# Patient Record
Sex: Female | Born: 1980 | Race: White | Hispanic: No | Marital: Married | Smoking: Never smoker
Health system: Southern US, Community
[De-identification: ages and names within clinical notes are randomized; demographics above are authoritative.]

## PROBLEM LIST (undated history)

## (undated) ENCOUNTER — Inpatient Hospital Stay (HOSPITAL_COMMUNITY): Payer: Self-pay

## (undated) DIAGNOSIS — N1330 Unspecified hydronephrosis: Secondary | ICD-10-CM

## (undated) DIAGNOSIS — M459 Ankylosing spondylitis of unspecified sites in spine: Secondary | ICD-10-CM

## (undated) DIAGNOSIS — Z8619 Personal history of other infectious and parasitic diseases: Secondary | ICD-10-CM

## (undated) DIAGNOSIS — I499 Cardiac arrhythmia, unspecified: Secondary | ICD-10-CM

## (undated) DIAGNOSIS — Z8759 Personal history of other complications of pregnancy, childbirth and the puerperium: Secondary | ICD-10-CM

## (undated) DIAGNOSIS — D649 Anemia, unspecified: Secondary | ICD-10-CM

## (undated) DIAGNOSIS — Z87440 Personal history of urinary (tract) infections: Secondary | ICD-10-CM

## (undated) DIAGNOSIS — R109 Unspecified abdominal pain: Secondary | ICD-10-CM

## (undated) DIAGNOSIS — K589 Irritable bowel syndrome without diarrhea: Secondary | ICD-10-CM

## (undated) DIAGNOSIS — Z1589 Genetic susceptibility to other disease: Secondary | ICD-10-CM

## (undated) DIAGNOSIS — K219 Gastro-esophageal reflux disease without esophagitis: Secondary | ICD-10-CM

## (undated) DIAGNOSIS — E7212 Methylenetetrahydrofolate reductase deficiency: Secondary | ICD-10-CM

## (undated) DIAGNOSIS — O26899 Other specified pregnancy related conditions, unspecified trimester: Secondary | ICD-10-CM

## (undated) HISTORY — PX: DILATION AND CURETTAGE OF UTERUS: SHX78

## (undated) HISTORY — DX: Personal history of urinary (tract) infections: Z87.440

## (undated) HISTORY — DX: Anemia, unspecified: D64.9

## (undated) HISTORY — DX: Personal history of other infectious and parasitic diseases: Z86.19

## (undated) HISTORY — DX: Gastro-esophageal reflux disease without esophagitis: K21.9

## (undated) HISTORY — DX: Genetic susceptibility to other disease: Z15.89

## (undated) HISTORY — DX: Personal history of urinary (tract) infections: Z87.59

## (undated) HISTORY — DX: Methylenetetrahydrofolate reductase deficiency: E72.12

---

## 2001-09-14 ENCOUNTER — Other Ambulatory Visit: Admission: RE | Admit: 2001-09-14 | Discharge: 2001-09-14 | Payer: Self-pay | Admitting: Family Medicine

## 2002-09-06 ENCOUNTER — Encounter: Admission: RE | Admit: 2002-09-06 | Discharge: 2002-09-06 | Payer: Self-pay | Admitting: Family Medicine

## 2002-09-06 ENCOUNTER — Encounter: Payer: Self-pay | Admitting: Family Medicine

## 2003-09-09 ENCOUNTER — Other Ambulatory Visit: Admission: RE | Admit: 2003-09-09 | Discharge: 2003-09-09 | Payer: Self-pay | Admitting: Family Medicine

## 2005-02-11 ENCOUNTER — Other Ambulatory Visit: Admission: RE | Admit: 2005-02-11 | Discharge: 2005-02-11 | Payer: Self-pay | Admitting: Family Medicine

## 2006-07-29 ENCOUNTER — Encounter: Admission: RE | Admit: 2006-07-29 | Discharge: 2006-07-29 | Payer: Self-pay | Admitting: Family Medicine

## 2008-06-13 IMAGING — US US TRANSVAGINAL NON-OB
1 series · 14 of 25 positions shown · non-contrast
Comparison: None.

CLINICAL DATA: Right lower quadrant pain.  Question ectopic pregnancy.  
 TRANSABDOMINAL AND TRANSVAGINAL PELVIC ULTRASOUND:
TECHNIQUE: Both transabdominal and transvaginal ultrasound examinations of the pelvis were performed including evaluation of the uterus, ovaries, adnexal regions, and pelvic cul-de-sac.

[Series 1: unknown · 0.32mm/px · 14 of 44 slices shown]
[im 1/44]
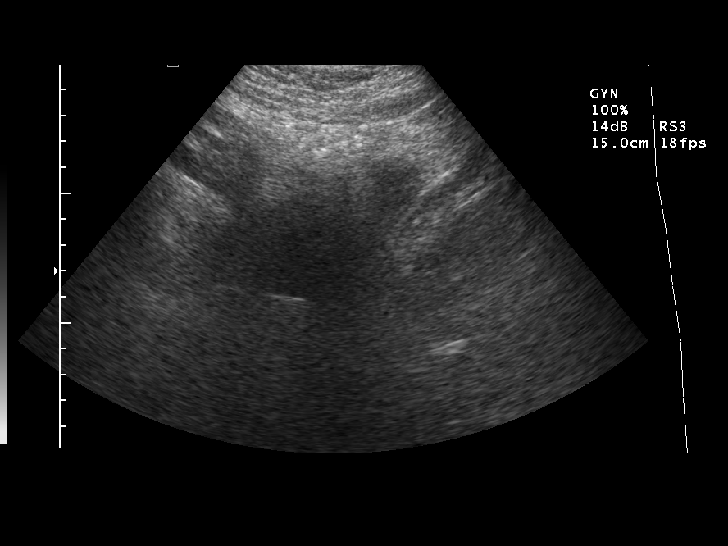
[im 4/44]
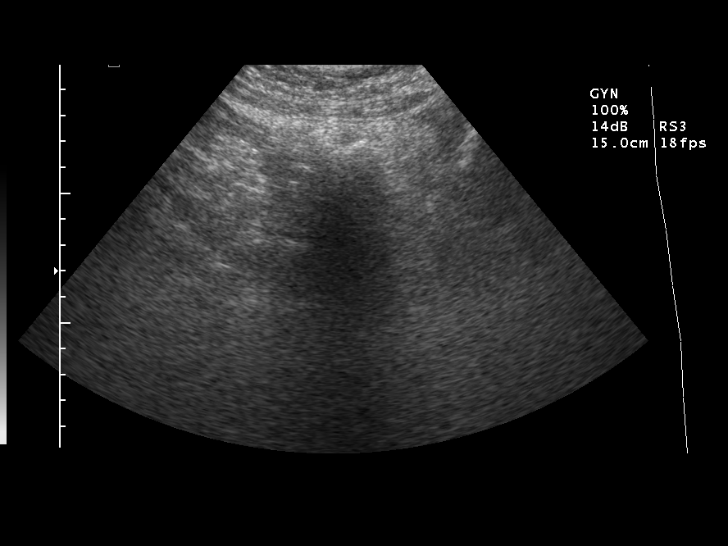
[im 8/44]
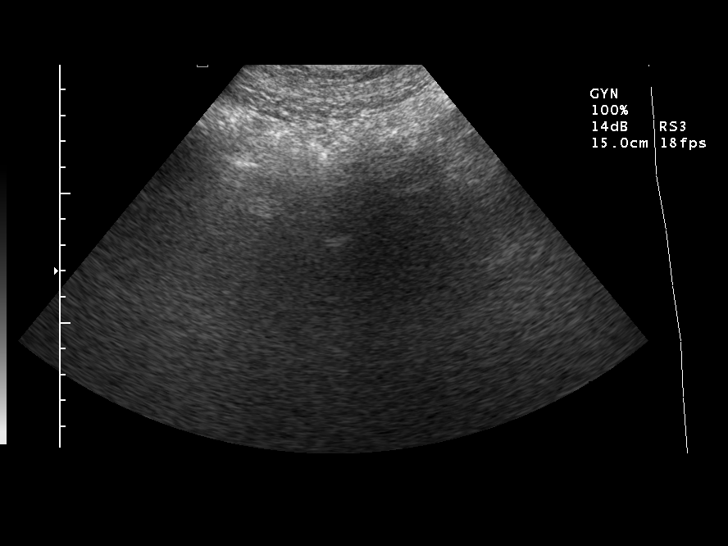
[im 11/44]
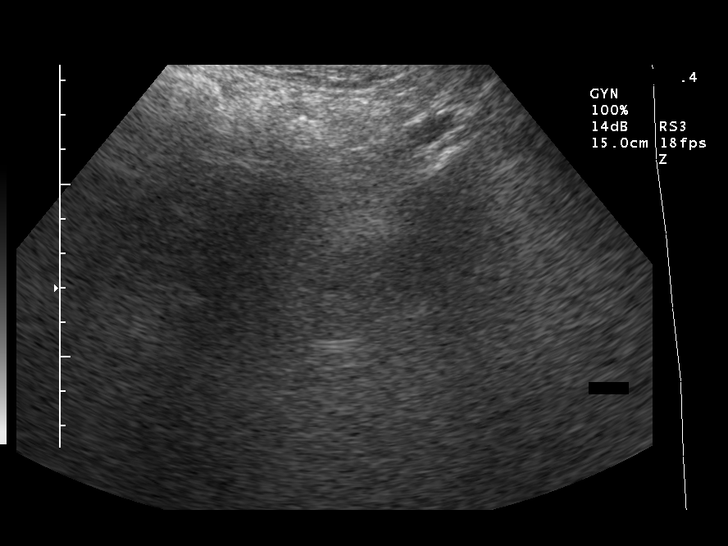
[im 15/44]
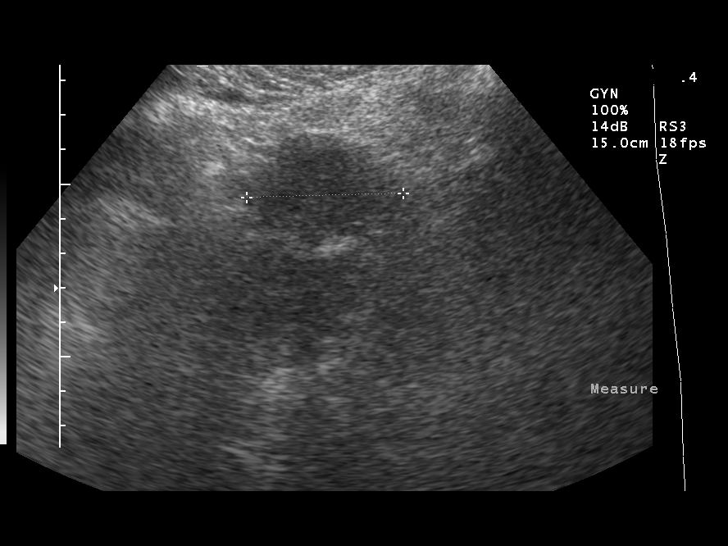
[im 17/44]
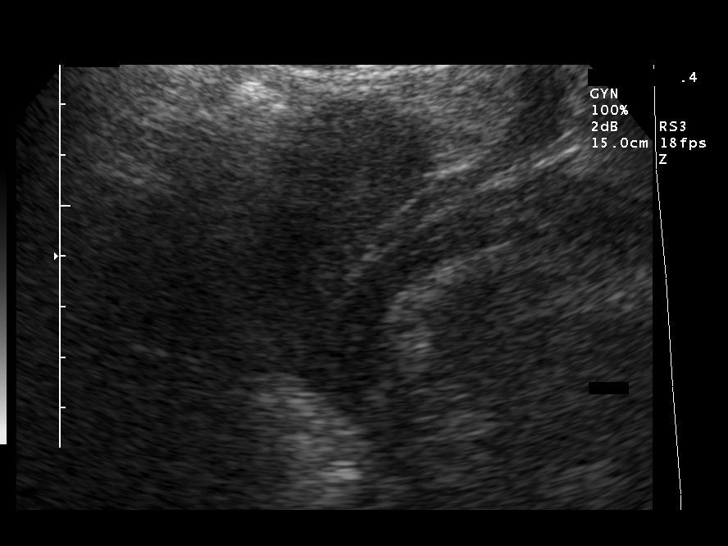
[im 20/44]
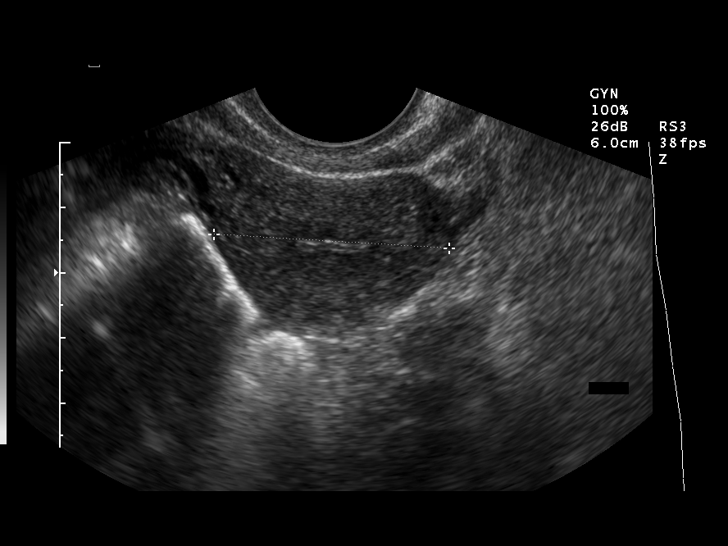
[im 24/44]
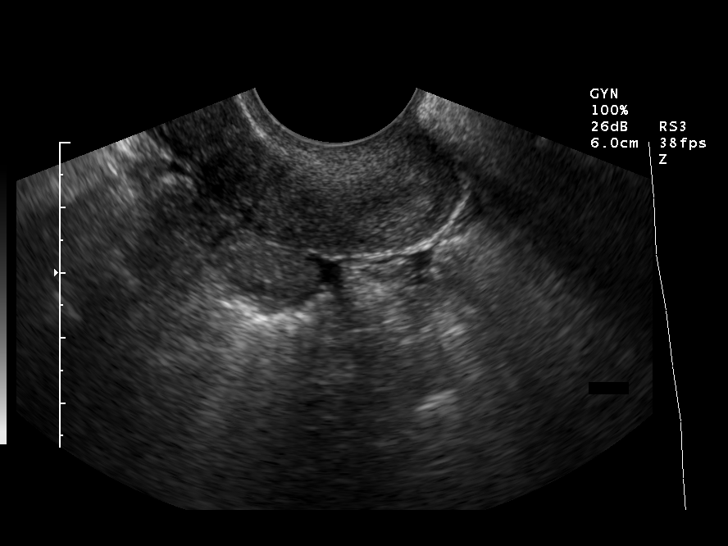
[im 27/44]
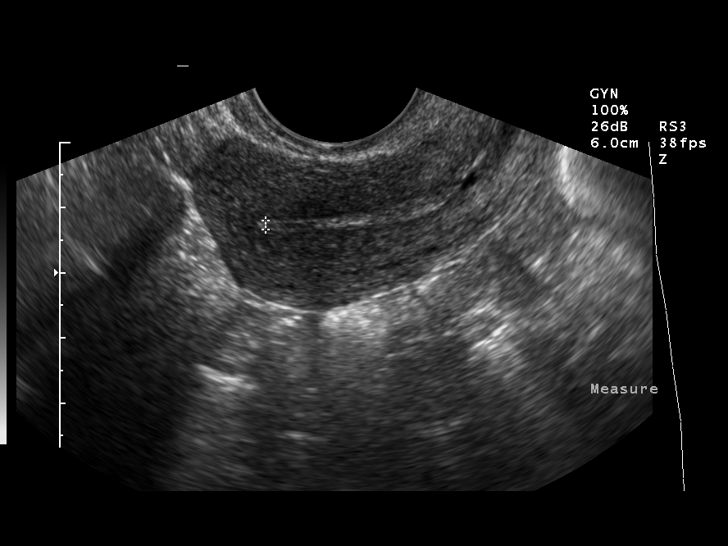
[im 29/44]
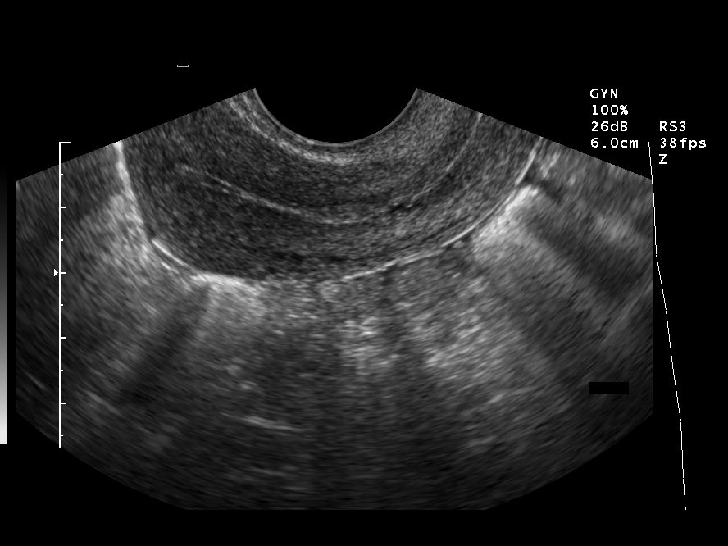
[im 33/44]
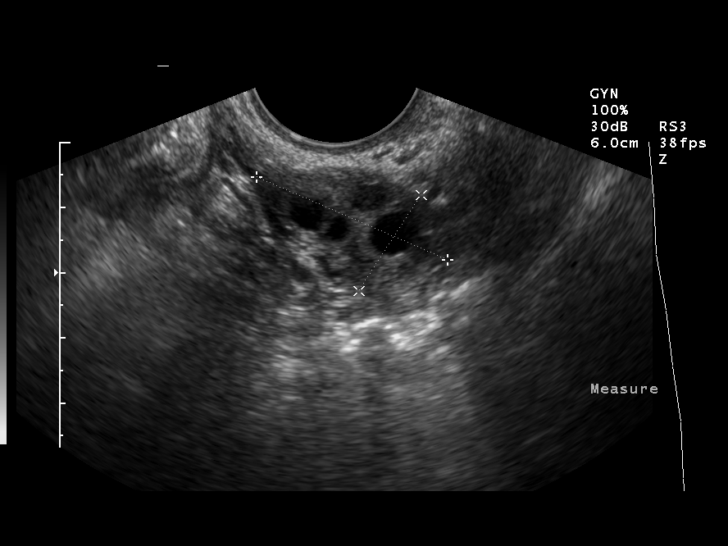
[im 36/44]
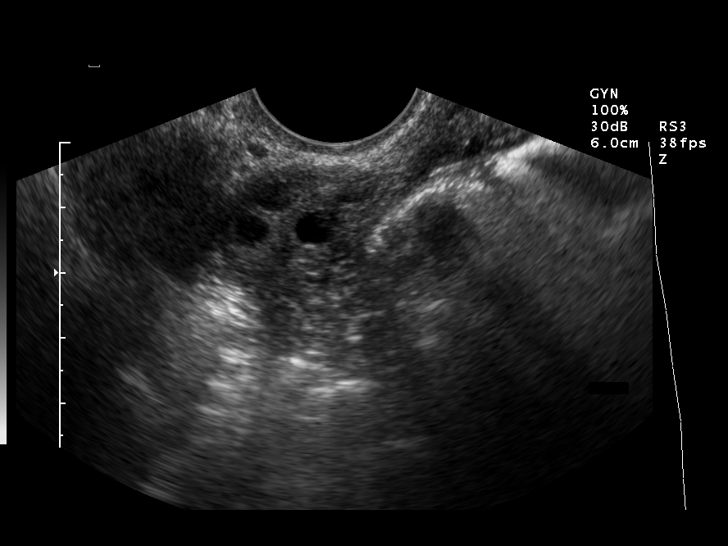
[im 40/44]
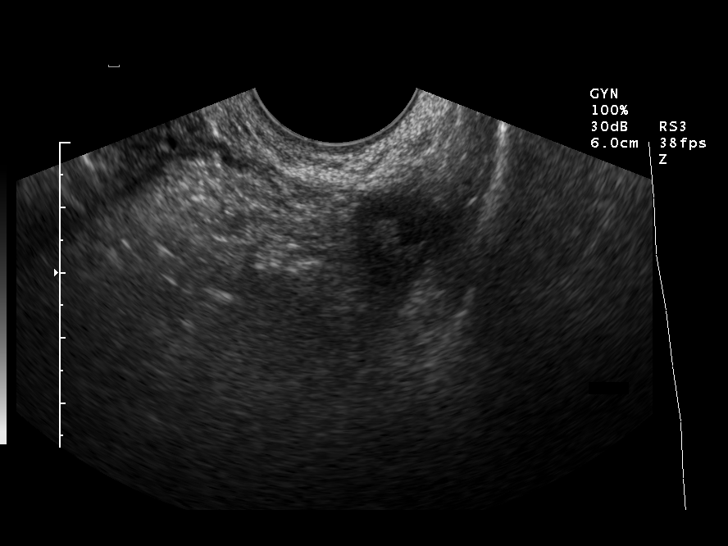
[im 44/44]
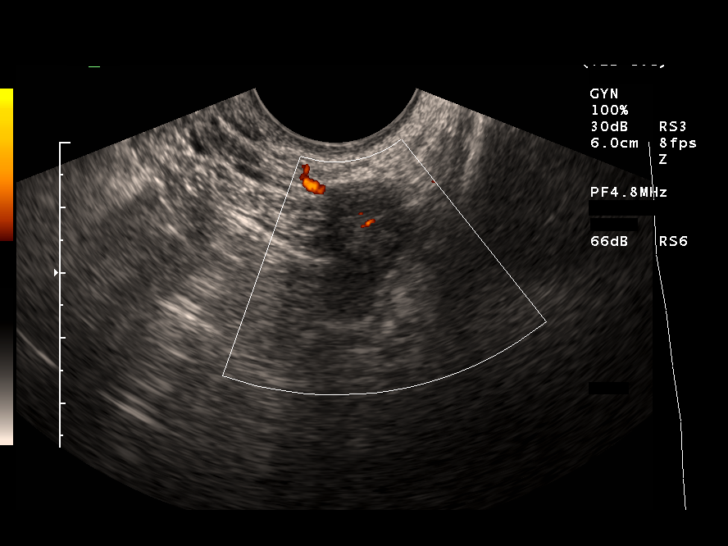

[14 of 25 positions shown; findings below may reference images not displayed]

FINDINGS: Uterus is 7.1 x 3.1 x 3.6 cm.  The endometrium is normal in thickness.  Minimal fluid is seen in the endometrial canal. 
 The right ovary is 3.2 x 1.8 x 2.1 cm.  The left ovary is 3.0 x 1.8 x 1.6 cm.  There is no adnexal or ovarian mass identified.  
 There is a small amount of free fluid.
IMPRESSION: 1.  No sonographic evidence of ectopic pregnancy. 
 2.  Normal pelvic ultrasound for age with a small amount of free fluid and a small amount of endometrial fluid.

## 2008-06-18 ENCOUNTER — Emergency Department (HOSPITAL_COMMUNITY): Admission: EM | Admit: 2008-06-18 | Discharge: 2008-06-18 | Payer: Self-pay | Admitting: Emergency Medicine

## 2011-02-26 ENCOUNTER — Other Ambulatory Visit: Payer: Self-pay | Admitting: Obstetrics and Gynecology

## 2011-02-26 DIAGNOSIS — O3680X0 Pregnancy with inconclusive fetal viability, not applicable or unspecified: Secondary | ICD-10-CM

## 2011-03-01 ENCOUNTER — Ambulatory Visit (HOSPITAL_COMMUNITY)
Admission: RE | Admit: 2011-03-01 | Discharge: 2011-03-01 | Disposition: A | Discharge status: Home / Self Care | Payer: Federal, State, Local not specified - PPO | Source: Ambulatory Visit | Attending: Obstetrics and Gynecology | Admitting: Obstetrics and Gynecology

## 2011-03-01 DIAGNOSIS — O209 Hemorrhage in early pregnancy, unspecified: Secondary | ICD-10-CM | POA: Insufficient documentation

## 2011-03-01 DIAGNOSIS — O3680X0 Pregnancy with inconclusive fetal viability, not applicable or unspecified: Secondary | ICD-10-CM

## 2011-03-01 DIAGNOSIS — Z3689 Encounter for other specified antenatal screening: Secondary | ICD-10-CM | POA: Insufficient documentation

## 2011-03-02 ENCOUNTER — Other Ambulatory Visit: Payer: Self-pay | Admitting: Obstetrics and Gynecology

## 2011-03-02 ENCOUNTER — Ambulatory Visit (HOSPITAL_COMMUNITY)
Admission: RE | Admit: 2011-03-02 | Discharge: 2011-03-02 | Disposition: A | Discharge status: Home / Self Care | Payer: Federal, State, Local not specified - PPO | Source: Ambulatory Visit | Attending: Obstetrics and Gynecology | Admitting: Obstetrics and Gynecology

## 2011-03-02 DIAGNOSIS — O021 Missed abortion: Secondary | ICD-10-CM | POA: Insufficient documentation

## 2011-03-19 NOTE — Op Note (Signed)
  NAME:  Diane Dalton, Diane Dalton                   ACCOUNT NO.:  [id number]  MEDICAL RECORD NO.:  [id number]  LOCATION:  [facility]                          FACILITY:  [facility]  PHYSICIAN:  Louella Medaglia P. Duyen Beckom, M.D.DATE OF BIRTH:  Sep 14, 1980  DATE OF PROCEDURE:  03/02/2011 DATE OF DISCHARGE:                              OPERATIVE REPORT   PREOPERATIVE DIAGNOSIS:  Missed abortion at 7 weeks.  POSTOPERATIVE DIAGNOSIS:  Missed abortion at 7 weeks, pathology pending.  PROCEDURE:  D and C with suction.  SURGEON:  Tycen Dockter P. Sabastian Raimondi, MD  ANESTHESIA:  Modified anesthesia care with paracervical block.  ESTIMATED BLOOD LOSS:  Minimal.  COMPLICATIONS:  None.  DESCRIPTION OF PROCEDURE:  The patient was taken to the operating room and after the induction of IV sedation by Anesthesia, she was placed in dorsal lithotomy position and prepped and draped in the usual fashion. A posterior weighted and anterior Sims retractor were placed.  The cervix was grasped on its anterior lip with a single-tooth tenaculum. Paracervical block was instituted by injecting 10 mL of 1% Xylocaine at 9 o'clock and 8 mL of 1% Xylocaine at 3 o'clock.  There was more resistance to the injection at 3 o'clock position which was why 2 mL left was used then on the right.  The uterus then sounded to 7 cm. Cervix was dilated to #21 Shawnie Pons.  A size 7 curved suction cannula was introduced gently into the cavity and suction curettage was carried out x2.  Moderate amount of tissue was obtained.  Gentle sharp curettage was then done, revealing the endometrial cavity to feel clean and gritty.  A final pass of the suction curette was done and the procedure was terminated.  The patient tolerated it well and went in satisfactory condition to post anesthesia recovery.     Mellanie Bejarano P. Dominik Lauricella, M.D.     CPR/MEDQ  D:  03/02/2011  T:  31-Mar-2011  Job:  [id number]  Electronically Signed by Meredeth Ide M.D. on 03/19/2011 01:31:14 PM

## 2011-03-31 DEATH — deceased

## 2011-07-07 LAB — OB RESULTS CONSOLE ABO/RH: RH Type: POSITIVE

## 2011-07-07 LAB — OB RESULTS CONSOLE HEPATITIS B SURFACE ANTIGEN: Hepatitis B Surface Ag: NEGATIVE

## 2011-07-07 LAB — OB RESULTS CONSOLE ANTIBODY SCREEN: Antibody Screen: NEGATIVE

## 2011-07-16 LAB — OB RESULTS CONSOLE GC/CHLAMYDIA: Gonorrhea: NEGATIVE

## 2011-08-31 NOTE — L&D Delivery Note (Signed)
Delivery Note  Complete dilation at 2104 Onset of pushing at 2115 FHR second stage category 1  Analgesia /Anesthesia intrapartum: epidural  Delivery of a viable female at 2215 by CNM in ROA position.  Nuchal Cord x2 - loose slid over head prior to birth. Cord double clamped after cessation of pulsation, cut by FOB.  Cord blood sample collected.   Placenta delivered via shultz intact with 3 VC at 2225 Placenta for disposal. Uterine tone firm /  bleeding small  1st degree laceration identified.  Anesthesia: Repair 4-0 subcuticular Est. Blood Loss (mL): 300  Complications: none  Mom to postpartum.  Baby to Mom for bonding.  [name] 01/22/2012, 10:34 PM

## 2012-01-19 LAB — OB RESULTS CONSOLE GBS: GBS: NEGATIVE

## 2012-01-22 ENCOUNTER — Inpatient Hospital Stay (HOSPITAL_COMMUNITY)
Admission: AD | Admit: 2012-01-22 | Discharge: 2012-01-24 | DRG: 372 | Disposition: A | Discharge status: Home / Self Care | Payer: Federal, State, Local not specified - PPO | Source: Ambulatory Visit | Attending: Obstetrics & Gynecology | Admitting: Obstetrics & Gynecology

## 2012-01-22 ENCOUNTER — Encounter (HOSPITAL_COMMUNITY): Payer: Self-pay | Admitting: *Deleted

## 2012-01-22 ENCOUNTER — Encounter (HOSPITAL_COMMUNITY): Payer: Self-pay | Admitting: Anesthesiology

## 2012-01-22 ENCOUNTER — Inpatient Hospital Stay (HOSPITAL_COMMUNITY): Payer: Federal, State, Local not specified - PPO | Admitting: Anesthesiology

## 2012-01-22 DIAGNOSIS — O4290 Premature rupture of membranes, unspecified as to length of time between rupture and onset of labor, unspecified weeks of gestation: Principal | ICD-10-CM | POA: Diagnosis present

## 2012-01-22 HISTORY — DX: Irritable bowel syndrome, unspecified: K58.9

## 2012-01-22 HISTORY — DX: Ankylosing spondylitis of unspecified sites in spine: M45.9

## 2012-01-22 HISTORY — DX: Cardiac arrhythmia, unspecified: I49.9

## 2012-01-22 LAB — CBC
HCT: 36.6 % (ref 36.0–46.0)
Hemoglobin: 12.1 g/dL (ref 12.0–15.0)
MCHC: 33.1 g/dL (ref 30.0–36.0)

## 2012-01-22 LAB — POCT FERN TEST: Fern Test: POSITIVE

## 2012-01-22 MED ORDER — TERBUTALINE SULFATE 1 MG/ML IJ SOLN: 0.2500 mg | Freq: Once | INTRAMUSCULAR | Status: DC | PRN

## 2012-01-22 MED ORDER — OXYTOCIN 20 UNITS IN LACTATED RINGERS INFUSION - SIMPLE
1.0000 m[IU]/min | INTRAVENOUS | Status: DC
  Administered 2012-01-22: 2 m[IU]/min via INTRAVENOUS
  Filled 2012-01-22: qty 1000

## 2012-01-22 MED ORDER — FENTANYL 2.5 MCG/ML BUPIVACAINE 1/10 % EPIDURAL INFUSION (WH - ANES)
14.0000 mL/h | INTRAMUSCULAR | Status: DC
  Administered 2012-01-22 (×2): 14 mL/h via EPIDURAL
  Filled 2012-01-22 (×2): qty 60

## 2012-01-22 MED ORDER — OXYCODONE-ACETAMINOPHEN 5-325 MG PO TABS: 1.0000 | ORAL_TABLET | ORAL | Status: DC | PRN

## 2012-01-22 MED ORDER — OXYTOCIN BOLUS FROM INFUSION
500.0000 mL | Freq: Once | INTRAVENOUS | Status: DC
  Filled 2012-01-22: qty 500

## 2012-01-22 MED ORDER — OXYTOCIN 20 UNITS IN LACTATED RINGERS INFUSION - SIMPLE
1.0000 m[IU]/min | INTRAVENOUS | Status: DC
  Administered 2012-01-22: 333 m[IU]/min via INTRAVENOUS

## 2012-01-22 MED ORDER — OXYTOCIN 20 UNITS IN LACTATED RINGERS INFUSION - SIMPLE: 125.0000 mL/h | Freq: Once | INTRAVENOUS | Status: DC

## 2012-01-22 MED ORDER — PHENYLEPHRINE 40 MCG/ML (10ML) SYRINGE FOR IV PUSH (FOR BLOOD PRESSURE SUPPORT): 80.0000 ug | PREFILLED_SYRINGE | INTRAVENOUS | Status: DC | PRN

## 2012-01-22 MED ORDER — LACTATED RINGERS IV SOLN
INTRAVENOUS | Status: DC
  Administered 2012-01-22 (×2): via INTRAVENOUS
  Administered 2012-01-22: 400 mL via INTRAVENOUS
  Administered 2012-01-22: 05:00:00 via INTRAVENOUS

## 2012-01-22 MED ORDER — ACETAMINOPHEN 325 MG PO TABS: 650.0000 mg | ORAL_TABLET | ORAL | Status: DC | PRN

## 2012-01-22 MED ORDER — EPHEDRINE 5 MG/ML INJ: 10.0000 mg | INTRAVENOUS | Status: DC | PRN

## 2012-01-22 MED ORDER — IBUPROFEN 600 MG PO TABS: 600.0000 mg | ORAL_TABLET | Freq: Four times a day (QID) | ORAL | Status: DC | PRN

## 2012-01-22 MED ORDER — LACTATED RINGERS IV SOLN: 500.0000 mL | Freq: Once | INTRAVENOUS | Status: DC

## 2012-01-22 MED ORDER — EPHEDRINE 5 MG/ML INJ
10.0000 mg | INTRAVENOUS | Status: DC | PRN
  Filled 2012-01-22: qty 4

## 2012-01-22 MED ORDER — DIPHENHYDRAMINE HCL 50 MG/ML IJ SOLN: 12.5000 mg | INTRAMUSCULAR | Status: DC | PRN

## 2012-01-22 MED ORDER — LACTATED RINGERS IV SOLN: 500.0000 mL | INTRAVENOUS | Status: DC | PRN

## 2012-01-22 MED ORDER — LIDOCAINE HCL (PF) 1 % IJ SOLN
30.0000 mL | INTRAMUSCULAR | Status: DC | PRN
  Filled 2012-01-22: qty 30

## 2012-01-22 MED ORDER — ONDANSETRON HCL 4 MG/2ML IJ SOLN: 4.0000 mg | Freq: Four times a day (QID) | INTRAMUSCULAR | Status: DC | PRN

## 2012-01-22 MED ORDER — LACTATED RINGERS IV SOLN: 500.0000 mL | INTRAVENOUS | Status: DC

## 2012-01-22 MED ORDER — CITRIC ACID-SODIUM CITRATE 334-500 MG/5ML PO SOLN: 30.0000 mL | ORAL | Status: DC | PRN

## 2012-01-22 MED ORDER — FLEET ENEMA 7-19 GM/118ML RE ENEM: 1.0000 | ENEMA | RECTAL | Status: DC | PRN

## 2012-01-22 MED ORDER — PHENYLEPHRINE 40 MCG/ML (10ML) SYRINGE FOR IV PUSH (FOR BLOOD PRESSURE SUPPORT)
80.0000 ug | PREFILLED_SYRINGE | INTRAVENOUS | Status: DC | PRN
  Filled 2012-01-22: qty 5

## 2012-01-22 MED ORDER — LIDOCAINE HCL (PF) 1 % IJ SOLN
INTRAMUSCULAR | Status: DC | PRN
  Administered 2012-01-22 (×3): 4 mL

## 2012-01-22 NOTE — H&P (Signed)
Anuhea KOREENA JOOST is a 31 y.o. female G2P0010 [redacted]w[redacted]d presenting for PROM.  RA:  Clear AF leakage with UCs.  HPP:  Declined genetic screen.  AFP1 neg.  [place] anato wnl.  1 hr GTT 132.  AGA.  OB History    Grav Para Term Preterm Abortions TAB SAB Ect Mult Living   2 0 0 0 1 0 1 0 0 0      Past Medical History  Diagnosis Date  . Spondylitis, ankylosing   . IBS (irritable bowel syndrome)   . Dysrhythmia    Past Surgical History  Procedure Date  . Dilation and curettage of uterus    Family History: family history is negative for Anesthesia problems. Social History:  reports that she has never smoked. She does not have any smokeless tobacco history on file. She reports that she does not drink alcohol or use illicit drugs. Current facility-administered medications:acetaminophen (TYLENOL) tablet 650 mg, 650 mg, Oral, Q4H PRN, Genia Del, MD;  citric acid-sodium citrate (ORACIT) solution 30 mL, 30 mL, Oral, Q2H PRN, Genia Del, MD;  ibuprofen (ADVIL,MOTRIN) tablet 600 mg, 600 mg, Oral, Q6H PRN, Genia Del, MD;  lactated ringers infusion 500-1,000 mL, 500-1,000 mL, Intravenous, PRN, Genia Del, MD lactated ringers infusion, , Intravenous, Continuous, Genia Del, MD, Last Rate: 125 mL/hr at 01/22/12 0525;  lidocaine (XYLOCAINE) 1 % injection 30 mL, 30 mL, Subcutaneous, PRN, Genia Del, MD;  ondansetron (ZOFRAN) injection 4 mg, 4 mg, Intravenous, Q6H PRN, Genia Del, MD;  oxyCODONE-acetaminophen (PERCOCET) 5-325 MG per tablet 1-2 tablet, 1-2 tablet, Oral, Q3H PRN, Genia Del, MD oxytocin (PITOCIN) IV BOLUS FROM BAG, 500 mL, Intravenous, Once, Genia Del, MD;  oxytocin (PITOCIN) IV infusion 20 units in LR 1000 mL, 125 mL/hr, Intravenous, Once, Genia Del, MD;  sodium phosphate (FLEET) 7-19 GM/118ML enema 1 enema, 1 enema, Rectal, PRN, Genia Del, MD Allergies  Allergen Reactions  . Sulfa Antibiotics Rash    Dilation: 1.5 Effacement  (%): 80 Station: -1 Exam by:: Quintella Baton RNC Blood pressure 115/61, pulse 74, temperature 98.6 F (37 C), temperature source Oral, resp. rate 18, height 5\' 4"  (1.626 m), weight 61.508 kg (135 lb 9.6 oz).  FHR reassuring.  BL 140's, variability present, no deceleration  HPP: There is no problem list on file for this patient.   Prenatal labs: ABO, Rh:  B+ Antibody:  Neg Rubella:  Immune RPR:  NR  HBsAg:    HIV:  NR  Genetic testing: Declined [place] anato: wnl 1 hr GTT: 132 GBS:  GBS neg  Assessment/Plan: 36 4/7 wks with PROM, entering labor, will stimulate with Pito.  F-Well-being reassuring. Expectant management towards probable vaginal delivery.  GBS neg  Susumu Hackler,MARIE-LYNE 01/22/2012, 7:08 AM

## 2012-01-22 NOTE — Progress Notes (Signed)
Patient ID: Diane Dalton, female   DOB: 06-02-1981, 31 y.o.   MRN: [id number]  S: Feeling painful ctx     Tolerating contractions well      Requesting epidural  O:  VS: Blood pressure 103/53, pulse 65, temperature 97.9 F (36.6 C), temperature source Oral, resp. rate 18, height 5\' 4"  (1.626 m), weight 61.508 kg (135 lb 9.6 oz).         FHR : baseline 130 / variability moderate / accels + / decels none        Toco: contractions every 2-4 minutes / moderate - strong / pitocin 16 mu/min        Cervix : 3 / 80% / vtx / 0 station        Membranes: clear fluid         IUPC placed  A: Latent labor     FHR category 1  P: Decrease pitocin to 10 mu/min until after epidural placement      Place epidural      IVF bolus LR now      Foley after epidural      Increase pitocin after foley per protocol to achieve adequate MVU     [name] 01/22/2012, 1:21 PM

## 2012-01-22 NOTE — Progress Notes (Signed)
S: Feeling some ctx / no pain / intermittent fluid leakage     Tolerating contractions well   O:  VS: Blood pressure 124/76, pulse 76, temperature 97.9 F (36.6 C), temperature source Oral, resp. rate 18, height 5\' 4"  (1.626 m), weight 61.508 kg (135 lb 9.6 oz).        FHR : baseline 130 / variability moderate / accels + / decels none        Toco: contractions every 2-4 minutes / mild / pitocin 2 mu/min        Cervix : deferred recheck at this time        Membranes: clear fluid        Informal bedside sono - cephalic / subjectively low AFI  A: PROM - labor augmentation with pitocin     Late preterm labor     Negative GBS     FHR category 1  P: pitocin augmentation      Limit exams - CNM for exams     [name] 01/22/2012, 9:36 AM

## 2012-01-22 NOTE — Anesthesia Preprocedure Evaluation (Signed)
Anesthesia Evaluation  Patient identified by MRN, date of birth, ID band Patient awake    Reviewed: Allergy & Precautions, H&P , NPO status , Patient's Chart, lab work & pertinent test results, reviewed documented beta blocker date and time   History of Anesthesia Complications Negative for: history of anesthetic complications  Airway Mallampati: I TM Distance: >3 FB Neck ROM: full    Dental  (+) Teeth Intact   Pulmonary neg pulmonary ROS,  breath sounds clear to auscultation        Cardiovascular negative cardio ROS  + dysrhythmias (irregular heartbeat, paroxysmal, no meds) Rhythm:regular Rate:Normal     Neuro/Psych negative neurological ROS  negative psych ROS   GI/Hepatic negative GI ROS, Neg liver ROS,   Endo/Other  negative endocrine ROS  Renal/GU negative Renal ROS     Musculoskeletal   Abdominal   Peds  Hematology negative hematology ROS (+)   Anesthesia Other Findings   Reproductive/Obstetrics (+) Pregnancy                           Anesthesia Physical Anesthesia Plan  ASA: II  Anesthesia Plan: Epidural   Post-op Pain Management:    Induction:   Airway Management Planned:   Additional Equipment:   Intra-op Plan:   Post-operative Plan:   Informed Consent: I have reviewed the patients History and Physical, chart, labs and discussed the procedure including the risks, benefits and alternatives for the proposed anesthesia with the patient or authorized representative who has indicated his/her understanding and acceptance.     Plan Discussed with:   Anesthesia Plan Comments:         Anesthesia Quick Evaluation

## 2012-01-22 NOTE — Progress Notes (Signed)
Dr Seymour Bars notified of pt's admission and status. Aware of PROM at 0100 with cl fld., sve, GBS collected last ov but no result found in computer or from call to [facility], reactive fhr. Will admit to [facility]

## 2012-01-22 NOTE — Progress Notes (Signed)
Patient ID: Diane Dalton, female   DOB: Jan 15, 1981, 31 y.o.   MRN: [id number]  S: Feeling well - no pain or pressure     Tolerating contractions well with epidural   O:  VS: Blood pressure 114/78, pulse 80, temperature 97.7 F (36.5 C), temperature source Oral, resp. rate 20, height 5\' 4"  (1.626 m), weight 61.508 kg (135 lb 9.6 oz).        FHR : baseline 125 / variability moderate / accels + / decels none        Toco: contractions every 2-3 minutes /  MVU 150-200 / pitocin 12 mu/min        Cervix : 8/100/vtx /+1        Membranes: clear fluid  / + show  A: active labor     FHR category 1  P: anticipate SVB in next few hours     Recheck cervix in 1-2 hours     Diane Dalton 01/22/2012, 7:46 PM

## 2012-01-22 NOTE — Anesthesia Procedure Notes (Signed)
Epidural Patient location during procedure: OB Start time: 01/22/2012 1:53 PM Reason for block: procedure for pain  Staffing Performed by: anesthesiologist   Preanesthetic Checklist Completed: patient identified, site marked, surgical consent, pre-op evaluation, timeout performed, IV checked, risks and benefits discussed and monitors and equipment checked  Epidural Patient position: sitting Prep: site prepped and draped and DuraPrep Patient monitoring: continuous pulse ox and blood pressure Approach: midline Injection technique: LOR air  Needle:  Needle type: Tuohy  Needle gauge: 17 G Needle length: 9 cm Needle insertion depth: 4 cm Catheter type: closed end flexible Catheter size: 19 Gauge Catheter at skin depth: 9 cm Test dose: negative  Assessment Events: blood not aspirated, injection not painful, no injection resistance, negative IV test and no paresthesia  Additional Notes Discussed risk of headache, infection, bleeding, nerve injury and failed or incomplete block.  Patient voices understanding and wishes to proceed.

## 2012-01-23 LAB — CBC
HCT: 32.2 % — ABNORMAL LOW (ref 36.0–46.0)
Hemoglobin: 10.9 g/dL — ABNORMAL LOW (ref 12.0–15.0)
MCH: 29.2 pg (ref 26.0–34.0)
RBC: 3.73 MIL/uL — ABNORMAL LOW (ref 3.87–5.11)

## 2012-01-23 MED ORDER — PRENATAL MULTIVITAMIN CH
1.0000 | ORAL_TABLET | Freq: Every day | ORAL | Status: DC
  Administered 2012-01-23 – 2012-01-24 (×2): 1 via ORAL
  Filled 2012-01-23 (×2): qty 1

## 2012-01-23 MED ORDER — LANOLIN HYDROUS EX OINT: TOPICAL_OINTMENT | CUTANEOUS | Status: DC | PRN

## 2012-01-23 MED ORDER — BENZOCAINE-MENTHOL 20-0.5 % EX AERO
1.0000 "application " | INHALATION_SPRAY | CUTANEOUS | Status: DC | PRN
  Filled 2012-01-23: qty 56

## 2012-01-23 MED ORDER — WITCH HAZEL-GLYCERIN EX PADS: 1.0000 "application " | MEDICATED_PAD | CUTANEOUS | Status: DC | PRN

## 2012-01-23 MED ORDER — OXYCODONE-ACETAMINOPHEN 5-325 MG PO TABS: 1.0000 | ORAL_TABLET | ORAL | Status: DC | PRN

## 2012-01-23 MED ORDER — DIPHENHYDRAMINE HCL 25 MG PO CAPS: 25.0000 mg | ORAL_CAPSULE | Freq: Four times a day (QID) | ORAL | Status: DC | PRN

## 2012-01-23 MED ORDER — TETANUS-DIPHTH-ACELL PERTUSSIS 5-2.5-18.5 LF-MCG/0.5 IM SUSP: 0.5000 mL | Freq: Once | INTRAMUSCULAR | Status: DC

## 2012-01-23 MED ORDER — IBUPROFEN 600 MG PO TABS
600.0000 mg | ORAL_TABLET | Freq: Four times a day (QID) | ORAL | Status: DC
  Administered 2012-01-23 – 2012-01-24 (×6): 600 mg via ORAL
  Filled 2012-01-23 (×9): qty 1

## 2012-01-23 MED ORDER — ONDANSETRON HCL 4 MG PO TABS: 4.0000 mg | ORAL_TABLET | ORAL | Status: DC | PRN

## 2012-01-23 MED ORDER — DIBUCAINE 1 % RE OINT: 1.0000 "application " | TOPICAL_OINTMENT | RECTAL | Status: DC | PRN

## 2012-01-23 MED ORDER — ONDANSETRON HCL 4 MG/2ML IJ SOLN: 4.0000 mg | INTRAMUSCULAR | Status: DC | PRN

## 2012-01-23 MED ORDER — SIMETHICONE 80 MG PO CHEW: 80.0000 mg | CHEWABLE_TABLET | ORAL | Status: DC | PRN

## 2012-01-23 MED ORDER — SENNOSIDES-DOCUSATE SODIUM 8.6-50 MG PO TABS
2.0000 | ORAL_TABLET | Freq: Every day | ORAL | Status: DC
  Administered 2012-01-23: 2 via ORAL

## 2012-01-23 MED ORDER — HYDROCORTISONE 2.5 % RE CREA
TOPICAL_CREAM | Freq: Three times a day (TID) | RECTAL | Status: DC
  Administered 2012-01-23 (×2): via RECTAL
  Filled 2012-01-23 (×2): qty 28.35

## 2012-01-23 NOTE — Progress Notes (Signed)
Patient ID: Diane Dalton, female   DOB: 08/03/1981, 31 y.o.   MRN: [id number]  PPD 1 SVD  S:  Reports feeling well             Tolerating po/ No nausea or vomiting             Bleeding is light             Pain controlled with motrin and percocet             Up ad lib / ambulatory  Newborn breast feeding  / Circumcision planned   O:  A & O x 3 NAD             VS: Blood pressure 103/52, pulse 68, temperature 98.1 F (36.7 C), temperature source Oral, resp. rate 18, height 5\' 4"  (1.626 m), weight 61.508 kg (135 lb 9.6 oz), unknown if currently breastfeeding.  LABS: WBC/Hgb/Hct/Plts:  17.3/10.9/32.2/168 (05/26 0602)   Lungs: Clear and unlabored  Heart: regular rate and rhythm   Abdomen: soft, non-tender, non-distended              Fundus: firm, non-tender, U-2  Perineum: mild edema  Lochia: light  Extremities: no edema, no calf pain or tenderness    A: PPD # 1   Doing well - stable status  P:  Routine post partum orders  Anticipate discharge tomorrow pending baby status / discharge by peds (5lb-4oz)  Diane Dalton 01/23/2012, 10:59 AM

## 2012-01-23 NOTE — Anesthesia Postprocedure Evaluation (Signed)
  Anesthesia Post-op Note  Patient: Diane Dalton  Procedure(s) Performed: * No procedures listed *  Patient Location: Mother/Baby  Anesthesia Type: Epidural  Level of Consciousness: awake, alert  and oriented  Airway and Oxygen Therapy: Patient Spontanous Breathing  Post-op Pain: mild  Post-op Assessment: Patient's Cardiovascular Status Stable, Respiratory Function Stable, Patent Airway, No signs of Nausea or vomiting and Pain level controlled  Post-op Vital Signs: stable  Complications: No apparent anesthesia complications

## 2012-01-24 MED ORDER — OXYCODONE-ACETAMINOPHEN 5-325 MG PO TABS: 1.0000 | ORAL_TABLET | ORAL | Status: AC | PRN

## 2012-01-24 MED ORDER — IBUPROFEN 600 MG PO TABS: 600.0000 mg | ORAL_TABLET | Freq: Four times a day (QID) | ORAL | Status: AC

## 2012-01-24 MED ORDER — HYDROCORTISONE 2.5 % RE CREA: TOPICAL_CREAM | Freq: Three times a day (TID) | RECTAL | Status: AC

## 2012-01-24 NOTE — Progress Notes (Signed)
PPD 2 SVD  S:  Reports feeling well             Tolerating po/ No nausea or vomiting             Bleeding is light             Pain controlled with motrin             Up ad lib / ambulatory  Newborn breast and bottle feeding  / Circumcision planned - awaiting peds clearance   O:  A & O x 3              VS: Blood pressure 98/60, pulse 82, temperature 98.5 F (36.9 C), temperature source Oral, resp. rate 18, height 5\' 4"  (1.626 m), weight 135 lb 9.6 oz (61.508 kg), unknown if currently breastfeeding.  Lungs: Clear and unlabored  Heart: regular rate and rhythm   Abdomen: soft, non-tender, non-distended              Fundus: firm, non-tender, U-1  Perineum: mild edema  Lochia: light  Extremities: no edema, no calf pain or tenderness    A: PPD # 2   Doing well - stable status  P:  Routine post partum orders  Discharge MOM - awaiting peds clearance for circ and discharge  Diane Dalton 01/24/2012, 10:18 AM

## 2012-01-24 NOTE — Discharge Summary (Signed)
OBSTETRICAL DISCHARGE SUMMARY   Patient ID: Diane Dalton MRN: [id number] DOB/AGE: Jun 25, 1981 31 y.o.  Admit date: 01/22/2012 Discharge date:  01/24/2012  Admission Diagnoses: SROM at 36 weeks   Discharge Diagnoses:  Late PreTerm Pregnancy-delivered SVD  Reason for Admission: onset of labor  Prenatal history: G2P0111   EDC : 02/15/2012, by Other Basis  [facility] at [facility]  Primary provider : Ernestina Penna Prenatal course complicated by premature rupture of membranes at 36 weeks  Prenatal Labs: ABO, Rh: B/Positive/-- (11/07 0000)  Antibody: Negative (11/07 0000) Rubella:  Immune RPR: NON REACTIVE (05/25 0525)  HBsAg: Negative (11/07 0000)  HIV: Non-reactive (11/07 0000)  GBS: Negative (05/22 1300)    Labor Summary: Admit with PPROM at 36 weeks / pitocin augmentation / negative GBS Anesthesia: epidural Procedures: none Complications: none  Newborn Data:  Gender: female Feeding method : breast and bottle Circumcision: planned Home with mother.      Discharge Information: Discharge Diagnoses: Late Pre Term Pregnancy-delivered  Activity: pelvic rest Diet: routine Medications: PNV, Ibuprofen, Colace, Iron, Percocet and anusol HC Condition: stable Instructions: refer to practice specific booklet Discharge to: home Follow up : [facility] at 6 weeks postpartum  Signed: [name] 01/24/2012, 10:22 AM

## 2012-01-24 NOTE — Progress Notes (Signed)
Pt called out for bottle. Teaching reinforced about importance and benefit of breastfeeding. Pt insisted on bottle feeding. Formula given

## 2013-01-14 IMAGING — US US OB COMP LESS 14 WK
2 series · 14 of 28 positions shown · non-contrast
Comparison: none

[Series 1: us ob comp less 14 wks · 46 acquisitions, 13 frames shown (1 of 2)]
[im 2/46]
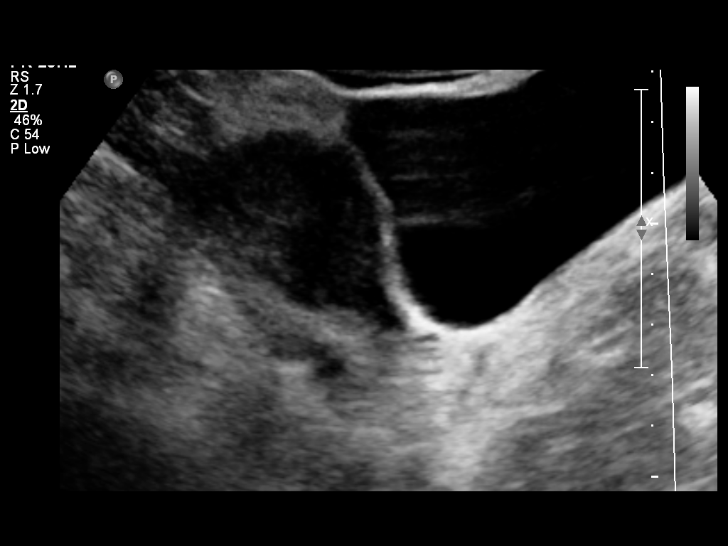
[im 6/46]
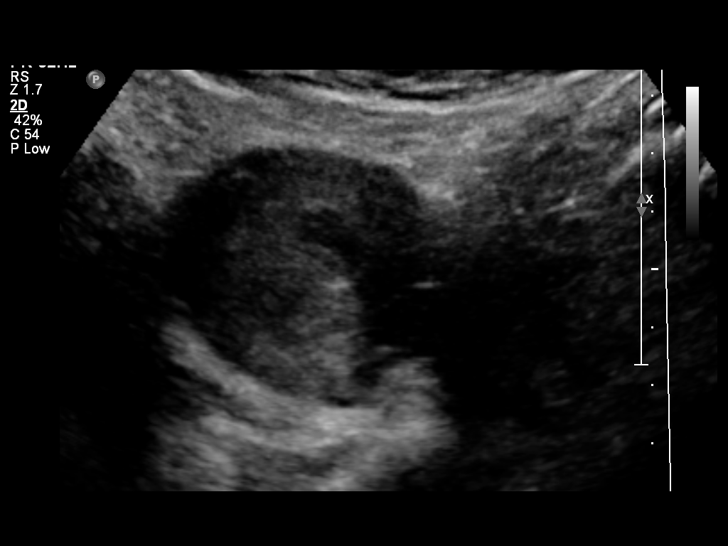
[im 10/46]
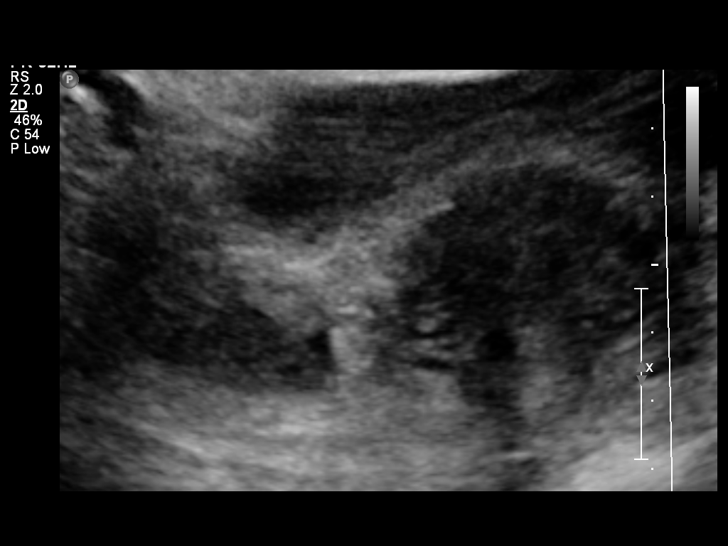
[im 13/46]
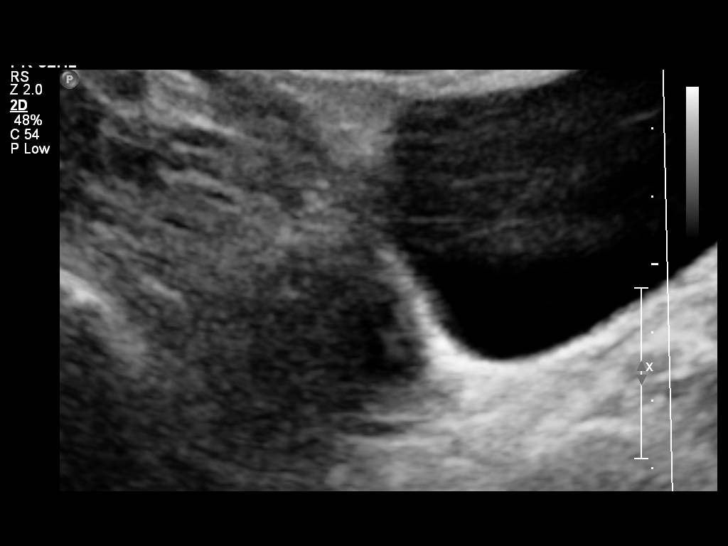
[im 17/46]
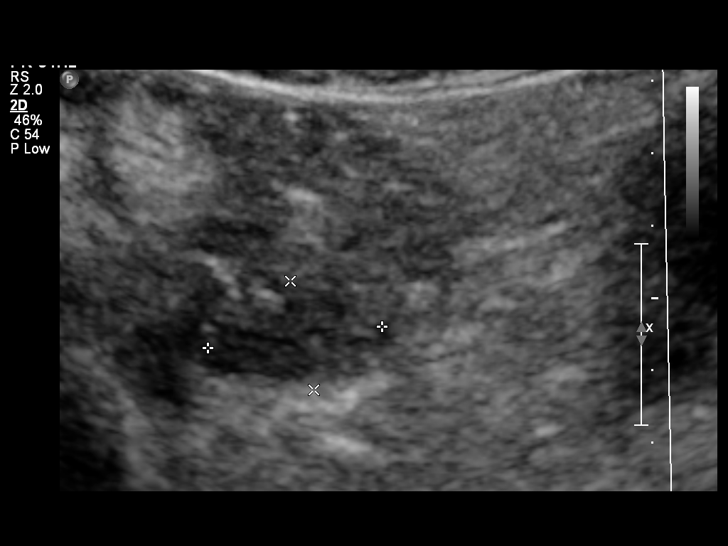
[im 20/46]
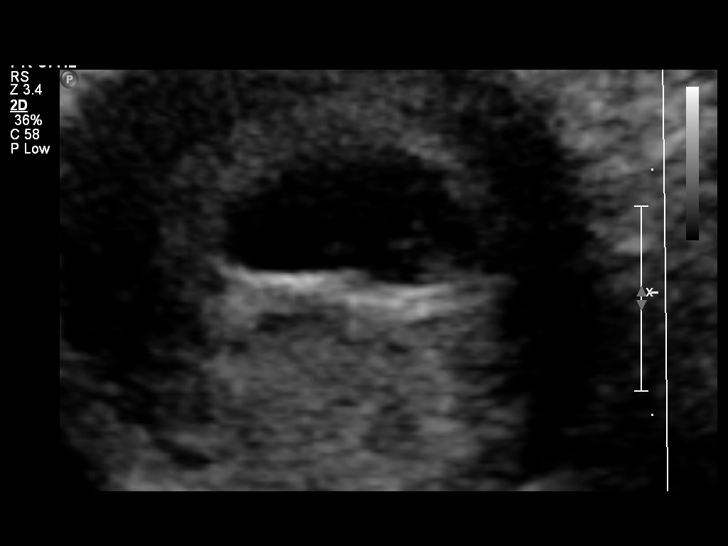
[im 24/46]
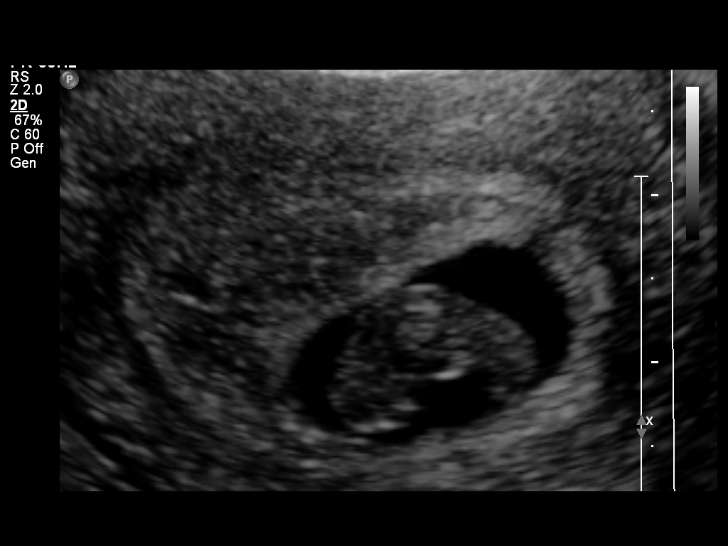
[im 28/46]
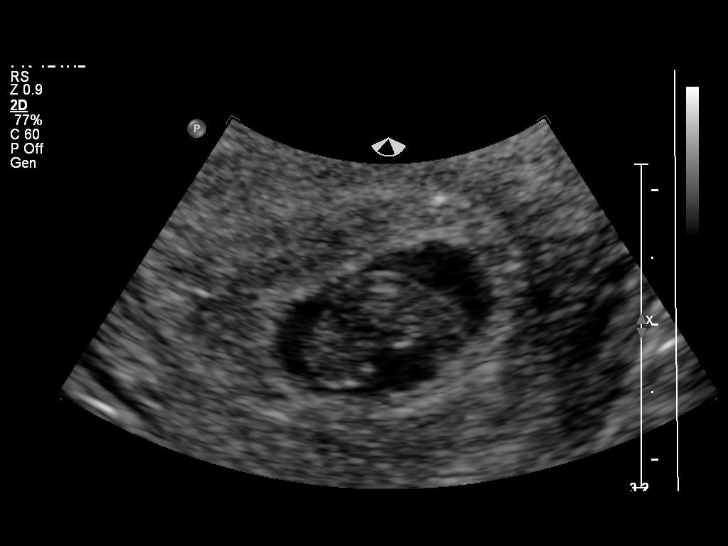
[im 31/46]
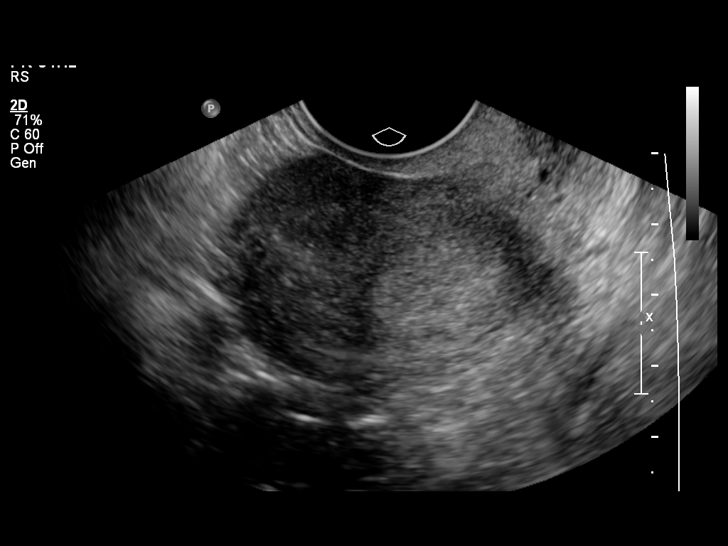
[im 35/46]
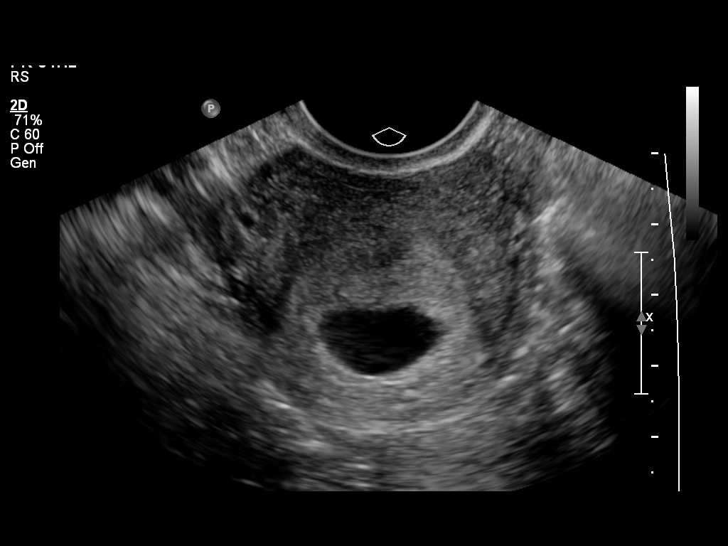
[im 38/46]
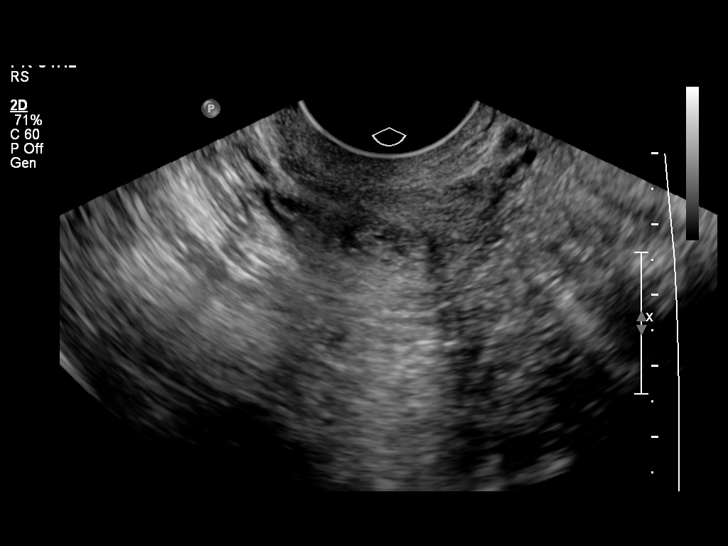
[im 42/46]
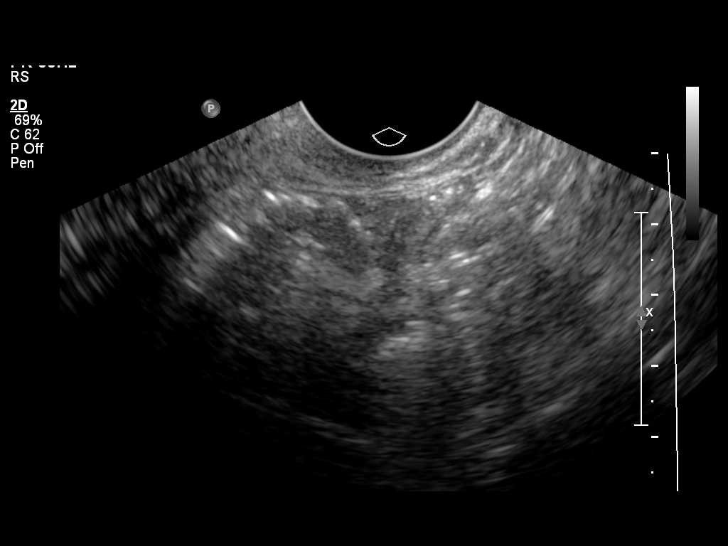
[im 46/46]
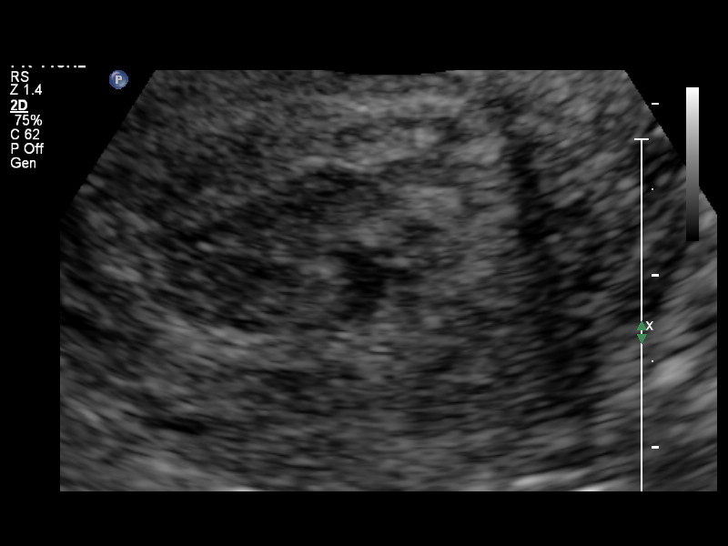

[Series 1: us ob comp less 14 wks · 1 of 4 slices shown (2 of 2)]
[im 4/4]
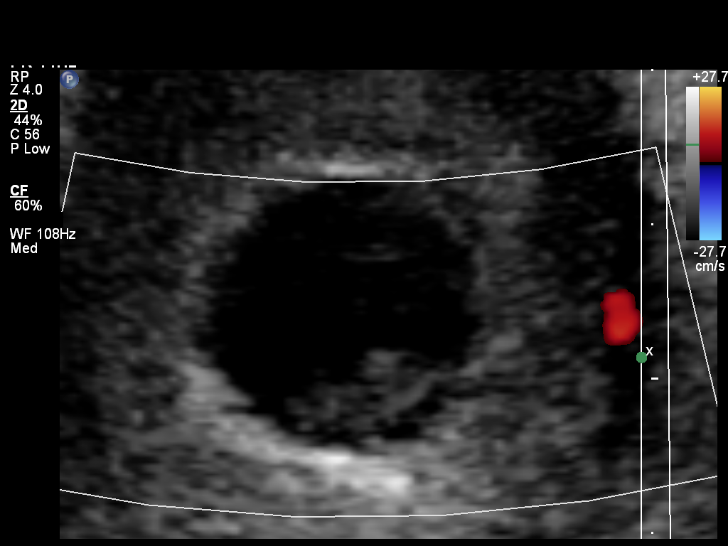

[14 of 28 positions shown; findings below may reference images not displayed]

OBSTETRICS REPORT
                      (Signed Final 03/01/2011 [DATE])

 Order#:         [id number]
                 0_O
Procedures

 US OB COMP LESS 14 WKS                                76801.0
 US OB TRANSVAGINAL                                    76817.0
Indications

 Vaginal bleeding, unknown etiology
Fetal Evaluation

 Preg. Location:    Intrauterine
 Gest. Sac:         Intrauterine
 Yolk Sac:          Not visualized
 Fetal Pole:        Visualized
 Cardiac Activity:  Not visualized
Biometry

 CRL:     12.8  mm     G. Age:  7w 3d                  EDD:    10/15/11
Gestational Age

 LMP:           9w 5d         Date:  12/23/10                 EDD:   09/29/11
 Best:          9w 5d      Det. By:  LMP  (12/23/10)          EDD:   09/29/11
Cervix Uterus Adnexa

 Cervix:       Closed.

 Left Ovary:    Within normal limits.
 Right Ovary:   Within normal limits.
 Adnexa:     No abnormality visualized.
Impression

 Findings meet criteria for failed IUP (missed abortion),
 measuring 7.5 wks.

 questions or concerns.

## 2013-08-17 ENCOUNTER — Ambulatory Visit (INDEPENDENT_AMBULATORY_CARE_PROVIDER_SITE_OTHER): Payer: Federal, State, Local not specified - PPO

## 2013-08-17 ENCOUNTER — Ambulatory Visit: Payer: Self-pay

## 2013-08-17 VITALS — BP 111/69 | HR 75 | Resp 18

## 2013-08-17 DIAGNOSIS — M7752 Other enthesopathy of left foot: Secondary | ICD-10-CM

## 2013-08-17 DIAGNOSIS — M79609 Pain in unspecified limb: Secondary | ICD-10-CM

## 2013-08-17 DIAGNOSIS — IMO0002 Reserved for concepts with insufficient information to code with codable children: Secondary | ICD-10-CM

## 2013-08-17 DIAGNOSIS — S90122S Contusion of left lesser toe(s) without damage to nail, sequela: Secondary | ICD-10-CM

## 2013-08-17 DIAGNOSIS — M7750 Other enthesopathy of unspecified foot: Secondary | ICD-10-CM

## 2013-08-17 NOTE — Progress Notes (Signed)
   Subjective:    Patient ID: Diane Dalton, female    DOB: [birth year], 32 y.o.   MRN: [id number]  HPIpain in left foot between the 4th and 5th toe and hurts on the outside of the foot and has been going on for 3 years and dull pain and no swelling and sore and I did hurt it back in 2011 snorking and the water was choppy and it pulled it into a coral    Review of Systems  Constitutional: Negative.   HENT: Negative.   Eyes: Negative.   Respiratory: Negative.   Cardiovascular: Negative.   Gastrointestinal: Negative.   Endocrine: Negative.   Genitourinary: Negative.   Musculoskeletal: Negative.   Skin: Negative.   Allergic/Immunologic: Negative.   Neurological: Negative.   Hematological: Negative.   Psychiatric/Behavioral: Negative.        Objective:   Physical Exam Neurovascular status is intact pedal pulses palpable epicritic and proprioceptive sensations intact and symmetric bilateral. 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 No open wounds or ulcerations are noted or 3 years ago did have a slight incident with a slight laceration or scrape on some coral while she was skin stored: However there is no evidence of open no ulceration no worsening       Assessment & Plan:  Assessment this time patient is a rectus foot type may have slight capsulitis secondary to shoes are tapered are turned in her infra-last also possibilities of contusion leaving her with a slight neuropraxia or neuritis or neuralgia of the fifth digit. Maintain wide accommodative shoes avoid anything that tighter or ballistic activities in the future. Suggested topical Aspercreme or pain patches or Neosporin with pain formula lidocaine gel. If he continues to be painful symptomatic followup as needed a BK for MRI of the noninvasive studies also suggested  ice pack to the area and a straight lash shoes to be maintained.  Alvan Dame DPM

## 2013-08-17 NOTE — Patient Instructions (Signed)
ICE INSTRUCTIONS  Apply ice or cold pack to the affected area at least 3 times a day for 10-15 minutes each time.  You should also use ice after prolonged activity or vigorous exercise.  Do not apply ice longer than 20 minutes at one time.  Always keep a cloth between your skin and the ice pack to prevent burns.  Being consistent and following these instructions will help control your symptoms.  We suggest you purchase a gel ice pack because they are reusable and do bit leak.  Some of them are designed to wrap around the area.  Use the method that works best for you.  Here are some other suggestions for icing.   Use a frozen bag of peas or corn-inexpensive and molds well to your body, usually stays frozen for 10 to 20 minutes.  Wet a towel with cold water and squeeze out the excess until it's damp.  Place in a bag in the freezer for 20 minutes. Then remove and use.  Make sure look for shoes that are straight and widen off and on squeezing on the toes to avoid contusion or nerve compression on the fifth toe.

## 2014-08-30 NOTE — L&D Delivery Note (Signed)
Delivery Note At 10:42 PM a viable female was delivered via Vaginal, Spontaneous Delivery (Presentation: Right Occiput Anterior).  APGAR: 9, 9; weight 6 lb 1.2 oz (2755 g).   Placenta status: Intact, placental atrophy of 1/5th of the placental surface, no clear abruption, Spontaneous Pathology.  Cord: 3 vessels with the following complications: None.  Cord pH: not indicated  Anesthesia: Epidural  Episiotomy: None Lacerations: None Suture Repair: n/a Est. Blood Loss (mL): 50 per nursing, 300 cc per MD  Mom to postpartum.  Baby to Couplet care / Skin to Skin.  Remington Highbaugh A. 07/02/2015, 12:13 AM

## 2014-12-16 LAB — OB RESULTS CONSOLE ABO/RH: RH Type: POSITIVE

## 2014-12-16 LAB — OB RESULTS CONSOLE GC/CHLAMYDIA
CHLAMYDIA, DNA PROBE: NEGATIVE
GC PROBE AMP, GENITAL: NEGATIVE

## 2014-12-16 LAB — OB RESULTS CONSOLE ANTIBODY SCREEN: Antibody Screen: NEGATIVE

## 2014-12-16 LAB — OB RESULTS CONSOLE HEPATITIS B SURFACE ANTIGEN: HEP B S AG: NEGATIVE

## 2014-12-16 LAB — OB RESULTS CONSOLE GBS: GBS: POSITIVE

## 2014-12-16 LAB — OB RESULTS CONSOLE RPR: RPR: NONREACTIVE

## 2014-12-16 LAB — OB RESULTS CONSOLE HIV ANTIBODY (ROUTINE TESTING): HIV: NONREACTIVE

## 2014-12-16 LAB — OB RESULTS CONSOLE RUBELLA ANTIBODY, IGM: RUBELLA: IMMUNE

## 2015-03-30 ENCOUNTER — Inpatient Hospital Stay (HOSPITAL_COMMUNITY): Payer: Federal, State, Local not specified - PPO

## 2015-03-30 ENCOUNTER — Encounter (HOSPITAL_COMMUNITY): Payer: Self-pay | Admitting: *Deleted

## 2015-03-30 ENCOUNTER — Inpatient Hospital Stay (HOSPITAL_COMMUNITY)
Admission: AD | Admit: 2015-03-30 | Discharge: 2015-04-02 | DRG: 781 | Disposition: A | Discharge status: Home / Self Care | Payer: Federal, State, Local not specified - PPO | Source: Ambulatory Visit | Attending: Obstetrics & Gynecology | Admitting: Obstetrics & Gynecology

## 2015-03-30 DIAGNOSIS — O212 Late vomiting of pregnancy: Secondary | ICD-10-CM | POA: Diagnosis present

## 2015-03-30 DIAGNOSIS — R109 Unspecified abdominal pain: Secondary | ICD-10-CM | POA: Diagnosis not present

## 2015-03-30 DIAGNOSIS — K589 Irritable bowel syndrome without diarrhea: Secondary | ICD-10-CM | POA: Diagnosis present

## 2015-03-30 DIAGNOSIS — O2302 Infections of kidney in pregnancy, second trimester: Principal | ICD-10-CM | POA: Diagnosis present

## 2015-03-30 DIAGNOSIS — N1330 Unspecified hydronephrosis: Secondary | ICD-10-CM

## 2015-03-30 DIAGNOSIS — E876 Hypokalemia: Secondary | ICD-10-CM | POA: Diagnosis present

## 2015-03-30 DIAGNOSIS — O26899 Other specified pregnancy related conditions, unspecified trimester: Secondary | ICD-10-CM

## 2015-03-30 DIAGNOSIS — O99612 Diseases of the digestive system complicating pregnancy, second trimester: Secondary | ICD-10-CM | POA: Diagnosis present

## 2015-03-30 DIAGNOSIS — Z3A25 25 weeks gestation of pregnancy: Secondary | ICD-10-CM | POA: Diagnosis present

## 2015-03-30 DIAGNOSIS — D649 Anemia, unspecified: Secondary | ICD-10-CM | POA: Diagnosis present

## 2015-03-30 DIAGNOSIS — O99012 Anemia complicating pregnancy, second trimester: Secondary | ICD-10-CM | POA: Diagnosis present

## 2015-03-30 HISTORY — DX: Unspecified hydronephrosis: N13.30

## 2015-03-30 HISTORY — DX: Unspecified abdominal pain: R10.9

## 2015-03-30 HISTORY — DX: Other specified pregnancy related conditions, unspecified trimester: O26.899

## 2015-03-30 LAB — CBC WITH DIFFERENTIAL/PLATELET
Basophils Absolute: 0 10*3/uL (ref 0.0–0.1)
Basophils Relative: 0 % (ref 0–1)
Eosinophils Absolute: 0 10*3/uL (ref 0.0–0.7)
Eosinophils Relative: 0 % (ref 0–5)
HEMATOCRIT: 29.4 % — AB (ref 36.0–46.0)
Hemoglobin: 10.1 g/dL — ABNORMAL LOW (ref 12.0–15.0)
LYMPHS PCT: 11 % — AB (ref 12–46)
Lymphs Abs: 1.5 10*3/uL (ref 0.7–4.0)
MCH: 29.7 pg (ref 26.0–34.0)
MCHC: 34.4 g/dL (ref 30.0–36.0)
MCV: 86.5 fL (ref 78.0–100.0)
Monocytes Absolute: 0.7 10*3/uL (ref 0.1–1.0)
Monocytes Relative: 5 % (ref 3–12)
NEUTROS ABS: 11.8 10*3/uL — AB (ref 1.7–7.7)
NEUTROS PCT: 84 % — AB (ref 43–77)
PLATELETS: 197 10*3/uL (ref 150–400)
RBC: 3.4 MIL/uL — ABNORMAL LOW (ref 3.87–5.11)
RDW: 13.9 % (ref 11.5–15.5)
WBC: 13.9 10*3/uL — AB (ref 4.0–10.5)

## 2015-03-30 LAB — URINALYSIS, ROUTINE W REFLEX MICROSCOPIC
Bilirubin Urine: NEGATIVE
GLUCOSE, UA: NEGATIVE mg/dL
HGB URINE DIPSTICK: NEGATIVE
Ketones, ur: 15 mg/dL — AB
Nitrite: NEGATIVE
PROTEIN: NEGATIVE mg/dL
SPECIFIC GRAVITY, URINE: 1.02 (ref 1.005–1.030)
UROBILINOGEN UA: 0.2 mg/dL (ref 0.0–1.0)
pH: 8 (ref 5.0–8.0)

## 2015-03-30 LAB — BASIC METABOLIC PANEL
ANION GAP: 1 — AB (ref 5–15)
BUN: 6 mg/dL (ref 6–20)
CALCIUM: 7.8 mg/dL — AB (ref 8.9–10.3)
CO2: 23 mmol/L (ref 22–32)
Chloride: 110 mmol/L (ref 101–111)
Creatinine, Ser: 0.49 mg/dL (ref 0.44–1.00)
GFR calc Af Amer: >60 mL/min (ref >60)
Glucose, Bld: 92 mg/dL (ref 65–99)
Potassium: 3.4 mmol/L — ABNORMAL LOW (ref 3.5–5.1)
Sodium: 134 mmol/L — ABNORMAL LOW (ref 135–145)

## 2015-03-30 LAB — URINE MICROSCOPIC-ADD ON

## 2015-03-30 MED ORDER — LACTATED RINGERS IV BOLUS (SEPSIS)
1000.0000 mL | Freq: Once | INTRAVENOUS | Status: AC
  Administered 2015-03-30: 1000 mL via INTRAVENOUS

## 2015-03-30 MED ORDER — LACTATED RINGERS IV SOLN
INTRAVENOUS | Status: DC
  Administered 2015-03-30: 05:00:00 via INTRAVENOUS

## 2015-03-30 MED ORDER — DIPHENHYDRAMINE HCL 50 MG/ML IJ SOLN: 12.5000 mg | Freq: Four times a day (QID) | INTRAMUSCULAR | Status: DC | PRN

## 2015-03-30 MED ORDER — HYDROMORPHONE HCL 1 MG/ML IJ SOLN
0.5000 mg | INTRAMUSCULAR | Status: DC | PRN
Start: 2015-03-30 — End: 2015-03-30
  Administered 2015-03-30: 0.5 mg via INTRAVENOUS
  Filled 2015-03-30: qty 1

## 2015-03-30 MED ORDER — FENTANYL CITRATE (PF) 100 MCG/2ML IJ SOLN
50.0000 ug | Freq: Once | INTRAMUSCULAR | Status: AC
  Administered 2015-03-30: 50 ug via INTRAVENOUS
  Filled 2015-03-30: qty 2

## 2015-03-30 MED ORDER — NALBUPHINE HCL 10 MG/ML IJ SOLN
10.0000 mg | Freq: Once | INTRAMUSCULAR | Status: AC
  Administered 2015-03-30: 10 mg via INTRAVENOUS
  Filled 2015-03-30: qty 1

## 2015-03-30 MED ORDER — ASPIRIN 81 MG PO CHEW
81.0000 mg | CHEWABLE_TABLET | Freq: Every day | ORAL | Status: DC
  Administered 2015-03-30 – 2015-04-02 (×4): 81 mg via ORAL
  Filled 2015-03-30 (×4): qty 1

## 2015-03-30 MED ORDER — PRENATAL MULTIVITAMIN CH
1.0000 | ORAL_TABLET | Freq: Every day | ORAL | Status: DC
  Administered 2015-03-30 – 2015-04-02 (×3): 1 via ORAL
  Filled 2015-03-30 (×4): qty 1

## 2015-03-30 MED ORDER — ONDANSETRON HCL 4 MG PO TABS
4.0000 mg | ORAL_TABLET | Freq: Once | ORAL | Status: AC
  Administered 2015-03-30: 4 mg via ORAL
  Filled 2015-03-30: qty 1

## 2015-03-30 MED ORDER — ONDANSETRON HCL 4 MG/2ML IJ SOLN: 4.0000 mg | Freq: Four times a day (QID) | INTRAMUSCULAR | Status: DC | PRN

## 2015-03-30 MED ORDER — TAMSULOSIN HCL 0.4 MG PO CAPS
0.4000 mg | ORAL_CAPSULE | Freq: Every day | ORAL | Status: DC
  Administered 2015-03-30 – 2015-04-02 (×4): 0.4 mg via ORAL
  Filled 2015-03-30 (×4): qty 1

## 2015-03-30 MED ORDER — DIPHENHYDRAMINE HCL 12.5 MG/5ML PO ELIX: 12.5000 mg | ORAL_SOLUTION | Freq: Four times a day (QID) | ORAL | Status: DC | PRN

## 2015-03-30 MED ORDER — HYDROMORPHONE 0.3 MG/ML IV SOLN
INTRAVENOUS | Status: DC
  Administered 2015-03-30: 1.2 mg via INTRAVENOUS
  Administered 2015-03-30: 14:00:00 via INTRAVENOUS
  Administered 2015-03-31: 0.6 mg via INTRAVENOUS
  Administered 2015-03-31: 0.9 mg via INTRAVENOUS
  Administered 2015-03-31: 0 mg via INTRAVENOUS
  Administered 2015-03-31 (×2): 0.6 mg via INTRAVENOUS
  Administered 2015-04-01 (×2): 1.2 mg via INTRAVENOUS
  Administered 2015-04-01: via INTRAVENOUS
  Administered 2015-04-01: 0.9 mg via INTRAVENOUS
  Filled 2015-03-30 (×2): qty 25

## 2015-03-30 MED ORDER — NALOXONE HCL 0.4 MG/ML IJ SOLN: 0.4000 mg | INTRAMUSCULAR | Status: DC | PRN

## 2015-03-30 MED ORDER — CEFAZOLIN SODIUM 1-5 GM-% IV SOLN
1.0000 g | Freq: Three times a day (TID) | INTRAVENOUS | Status: DC
  Administered 2015-03-30 – 2015-04-02 (×9): 1 g via INTRAVENOUS
  Filled 2015-03-30 (×10): qty 50

## 2015-03-30 MED ORDER — SODIUM CHLORIDE 0.9 % IJ SOLN: 9.0000 mL | INTRAMUSCULAR | Status: DC | PRN

## 2015-03-30 MED ORDER — ONDANSETRON HCL 4 MG/2ML IJ SOLN
4.0000 mg | Freq: Once | INTRAMUSCULAR | Status: AC
  Administered 2015-03-30: 4 mg via INTRAVENOUS
  Filled 2015-03-30: qty 2

## 2015-03-30 MED ORDER — NALBUPHINE HCL 10 MG/ML IJ SOLN
10.0000 mg | INTRAMUSCULAR | Status: AC
  Administered 2015-03-30: 10 mg via SUBCUTANEOUS
  Filled 2015-03-30: qty 1

## 2015-03-30 MED ORDER — ONDANSETRON HCL 4 MG/2ML IJ SOLN
4.0000 mg | Freq: Four times a day (QID) | INTRAMUSCULAR | Status: DC | PRN
  Administered 2015-03-30 – 2015-04-01 (×4): 4 mg via INTRAVENOUS
  Filled 2015-03-30 (×4): qty 2

## 2015-03-30 MED ORDER — LACTATED RINGERS IV SOLN
INTRAVENOUS | Status: DC
  Administered 2015-03-30 – 2015-04-01 (×7): via INTRAVENOUS

## 2015-03-30 NOTE — Progress Notes (Signed)
Wiliam Ke CNM notified of pt's increase in pain. Orders received

## 2015-03-30 NOTE — MAU Note (Signed)
About 2230 started having some R flank pain. Has vomited several times. Denies LOF or bleeding. No diarrhea.

## 2015-03-30 NOTE — H&P (Addendum)
Diane Dalton is a 34 y.o. female presenting for right flank pain, nausea/ vomiting since 11 pm last night so presented to MAU this morning. Denies fever/ chills. Denies hematuria. Denies h/o renal stones/ bladder or kidney infections. No Ob complaints. No UCs/ bleeding/ leaking.  Ob care with Dr Ernestina Penna. MTHFR heterozygous, on bbASA, Prior SVD at 36.4 wks and after counseling deferring 17-P injections.   History OB History    Gravida Para Term Preterm AB TAB SAB Ectopic Multiple Living       Past Medical History  Diagnosis Date  . Spondylitis, ankylosing   . IBS (irritable bowel syndrome)   . Dysrhythmia   . Pregnancy with flank pain, antepartum 03/30/2015  . Hydronephrosis, right 03/30/2015   Past Surgical History  Procedure Laterality Date  . Dilation and curettage of uterus     Family History: family history is negative for Anesthesia problems. Social History:  reports that she has never smoked. She has never used smokeless tobacco. She reports that she does not drink alcohol or use illicit drugs.  ROS neg     Blood pressure 86/42, pulse 72, temperature 98.2 F (36.8 C), temperature source Oral, resp. rate 16, height  (1.626 m), weight 122 lb 6.4 oz (55.52 kg), SpO2 100 %. Exam Physical Exam  A&O x 3, no acute distress. Pleasant HEENT neg, Lungs CTA bilat CV RRR, S1S2 normal Abdo soft, non tender, soft gravid uterus. Normal bowel sounds.   Back Right CVA tenderness (+) Extr no edema/ tenderness Pelvic deferred FHT  150 Toco none  Renal sono - No stones, mild right hydronephrosis, absent right ureteral jet.   Assessment/Plan: 25.6 wks with right flank pain possible renal tone v/s pyelonephritis, less likely appendicitis (no fever, abdo soft, has colicky pain in flank).  Pain control with Dilaudid PCA, Ancef for possible infection, hydate well. Flomax to relax ureteral pain, Rest. FHTs q shift.   Brittany Osier R 03/30/2015, 2:48 PM

## 2015-03-30 NOTE — Progress Notes (Signed)
Pt vomited. Ok to d/c efm per [name] CNM

## 2015-03-30 NOTE — MAU Provider Note (Signed)
  History     CSN: [id number]  Arrival date and time: 03/30/15 0258      Chief Complaint  Patient presents with  . Emesis During Pregnancy   HPI Right flank pain - sudden onset tonight Unable to sleep due to pain Nausea and vomiting with waves of pain  Past Medical History  Diagnosis Date  . Spondylitis, ankylosing   . IBS (irritable bowel syndrome)   . Dysrhythmia     Past Surgical History  Procedure Laterality Date  . Dilation and curettage of uterus      Family History  Problem Relation Age of Onset  . Anesthesia problems Neg Hx     History  Substance Use Topics  . Smoking status: Never Smoker   . Smokeless tobacco: Never Used  . Alcohol Use: No    Allergies:  Allergies  Allergen Reactions  . Sulfa Antibiotics Rash    ROS  + FM denies ctx / cramping / bleeding right flank pain - intermittent and sharp/stabbing (+) nausea and vomiting occuring with pain no diarrhea no epigastric pain  Physical Exam   Blood pressure 109/62, pulse 80, temperature 98.3 F (36.8 C), resp. rate 18, height  (1.626 m), weight 55.52 kg (122 lb 6.4 oz).  Physical Exam  Alert and oriented / moderate discomfort - pain Lungs clear and unlabored respirations Abdomen soft and non-tender Defer pelvic exam No suprapubic pain or tenderness + right CVA tenderness / negative left CVA  NST 125 baseline with 10x10 accels and moderate variability / no decels Toco - no ctx or UI   Results for Diane, Dalton (MRN [id number]) as of 03/30/2015 03:34  Ref. Range 03/30/2015 03:15  Appearance Latest Ref Range: CLEAR  CLEAR  Bacteria, UA Latest Ref Range: RARE  MANY (A)  Bilirubin Urine Latest Ref Range: NEGATIVE  NEGATIVE  Color, Urine Latest Ref Range: YELLOW  YELLOW  Glucose Latest Ref Range: NEGATIVE mg/dL NEGATIVE  Hgb urine dipstick Latest Ref Range: NEGATIVE  NEGATIVE  Ketones, ur Latest Ref Range: NEGATIVE mg/dL 15 (A)  Leukocytes, UA Latest Ref Range: NEGATIVE  SMALL (A)   Nitrite Latest Ref Range: NEGATIVE  NEGATIVE  pH Latest Ref Range: 5.0-8.0  8.0  Protein Latest Ref Range: NEGATIVE mg/dL NEGATIVE  Specific Gravity, Urine Latest Ref Range: 1.005-1.030  1.020  Squamous Epithelial / LPF Latest Ref Range: RARE  RARE  Urine-Other Unknown MUCOUS PRESENT  Urobilinogen, UA Latest Ref Range: 0.0-1.0 mg/dL 0.2  WBC, UA Latest Ref Range: <3 WBC/hpf 0-2   MAU Course  Procedures  IV hydration and analgesia Renal ultrasound Strain urine   Assessment and Plan  25.6 weeks Right flank pain - possible pyelectasis / no evidence of pyelo or ascending UTI Mild dehydration with nausea and vomting  1) Zofran  PO now 2) Nubain  subQ 3) IV LR x 2 liters 4) strain urine 5) NST then FHR Q 2 hours 6) re-evaluate after IV hydration  Diane Dalton, Diane Dalton 03/30/2015, 3:25 AM   0600 - repeated IV pain medication after another episode of sharp flank pain with nausea and vomiting - awoke from sleep with pain          - renal sono ordered / resulted with right pyelectasis           - unrelieved pain      Dr Juliene Pina to see and evaluate for possible admission for pain management

## 2015-03-31 DIAGNOSIS — R109 Unspecified abdominal pain: Secondary | ICD-10-CM | POA: Diagnosis present

## 2015-03-31 LAB — URINE CULTURE: Special Requests: NORMAL

## 2015-03-31 LAB — URINE MICROSCOPIC-ADD ON

## 2015-03-31 LAB — URINALYSIS, ROUTINE W REFLEX MICROSCOPIC
BILIRUBIN URINE: NEGATIVE
Glucose, UA: NEGATIVE mg/dL
Hgb urine dipstick: NEGATIVE
Ketones, ur: NEGATIVE mg/dL
NITRITE: NEGATIVE
PH: 5.5 (ref 5.0–8.0)
Protein, ur: NEGATIVE mg/dL
Specific Gravity, Urine: 1.01 (ref 1.005–1.030)
UROBILINOGEN UA: 0.2 mg/dL (ref 0.0–1.0)

## 2015-03-31 NOTE — Progress Notes (Signed)
Patient ID: Diane Dalton, female   DOB: 07-14-1981, 34 y.o.   MRN: [id number]  26 wks pregnancy, Acute severe right flank pain. HD#2.   Subjective: Pain better but not resolved, still using Dilaudid PCA, pain comes back if not staying on top of the PCA, pain is still in right flank, radiates to right side. Nausea better, able to eat some this morning. No fever/ chills. No dysuria/ hematuria/stone.  No SOB/ abdo pain. No UCs/ vag bleeding/ leaking. Feels good FMs.   Objective: Vital signs in last 24 hours: Temp:  [97.5 F (36.4 C)-98.5 F (36.9 C)] 98.4 F (36.9 C) (08/01 1059) Pulse Rate:  [64-73] 67 (08/01 1059) Resp:  [12-18] 16 (08/01 1059) BP: (86-106)/(40-54) 106/54 mmHg (08/01 1059) SpO2:  [99 %-100 %] 99 % (08/01 1059) Weight:  [124 lb 4 oz (56.359 kg)] 124 lb 4 oz (56.359 kg) (08/01 0541) Weight change: 1 lb 13.6 oz (0.839 kg) Last BM Date: 03/29/15  Intake/Output from previous day: 07/31 0701 - 08/01 0700 In: 2642.9 [P.O.:1720; I.V.:922.9] Out: 3600 [Urine:3600] Intake/Output this shift: Total I/O In: 2290.4 [P.O.:180; I.V.:2110.4] Out: 300 [Urine:300]  Physical exam:  A&O x 3, no acute distress. Pleasant HEENT negLungs CTA bilat CV RRR, S1S2 normal Abdo soft, non tender, non acute, soft, gravid non tender uterus Back- Right CVA tenderness present. Left normal  Extr no edema/ tenderness Pelvic def FHT 140s q shift.  Plan NST daily   Lab Results: CBC    Component Value Date/Time   WBC 13.9* 03/30/2015 1023   RBC 3.40* 03/30/2015 1023   HGB 10.1* 03/30/2015 1023   HCT 29.4* 03/30/2015 1023   PLT 197 03/30/2015 1023   MCV 86.5 03/30/2015 1023   MCH 29.7 03/30/2015 1023   MCHC 34.4 03/30/2015 1023   RDW 13.9 03/30/2015 1023   LYMPHSABS 1.5 03/30/2015 1023   MONOABS 0.7 03/30/2015 1023   EOSABS 0.0 03/30/2015 1023   BASOSABS 0.0 03/30/2015 1023      CMP Latest Ref Rng 03/30/2015  Glucose 65 - 99 mg/dL 92  BUN 6 - 20 mg/dL 6  Creatinine 4.09 - 8.11 mg/dL  9.14  Sodium 782 - 956 mmol/L 134(L)  Potassium 3.5 - 5.1 mmol/L 3.4(L)  Chloride 101 - 111 mmol/L 110  CO2 22 - 32 mmol/L 23  Calcium 8.9 - 10.3 mg/dL 7.8(L)     Studies/Results: US Renal  03/30/2015   CLINICAL DATA:  34 year old pregnant female with acute right flank pain for 10 hours.  EXAM: RENAL / URINARY TRACT ULTRASOUND COMPLETE  COMPARISON:  None  FINDINGS: Right Kidney:  Length: 11.9 cm. Mild hydronephrosis is noted. No renal calculi or solid renal mass identified.  Left Kidney:  Length: 11.6 cm. Echogenicity within normal limits. No mass or hydronephrosis visualized.  Bladder:  Appears normal for degree of bladder distention. A left ureteral jet is identified. Right ureteral jet is not noted.  IMPRESSION: Mild right hydronephrosis. Although this may be related to hydronephrosis of pregnancy, lack of right ureteral jet could represent right ureteral obstruction.  Unremarkable left kidney.   Electronically Signed   By: Harmon Pier M.D.   On: 03/30/2015 08:20    Medications: Ancef (IV), Flomax, Dilaudid PCA, Zofran prn, bbASA, PNVit  Assessment/Plan: HD#2. Right flank pain, D/D Early Pyelonephritis (but urine quite clear except bacteria) /renal stone (but no hematuria) / Appendicitis (but is non acute abdomen) / Hydronephrosis without infection/ musculoskeletal  Recheck UA today. Urine culture pending. Straining urine but negative so far  Continue pain control with PCA. Consider repeat renal sono tomorrow, Uro consult if worse. Add NST but has good FHT and good Kick counts.    Mccayla Shimada R 03/31/2015, 11:55 AM

## 2015-04-01 LAB — COMPREHENSIVE METABOLIC PANEL
ALT: 10 U/L — ABNORMAL LOW (ref 14–54)
ANION GAP: 1 — AB (ref 5–15)
AST: 15 U/L (ref 15–41)
Albumin: 2.5 g/dL — ABNORMAL LOW (ref 3.5–5.0)
Alkaline Phosphatase: 57 U/L (ref 38–126)
BUN: <5 mg/dL — ABNORMAL LOW (ref 6–20)
CHLORIDE: 108 mmol/L (ref 101–111)
CO2: 25 mmol/L (ref 22–32)
Calcium: 7.4 mg/dL — ABNORMAL LOW (ref 8.9–10.3)
Creatinine, Ser: 0.5 mg/dL (ref 0.44–1.00)
Glucose, Bld: 89 mg/dL (ref 65–99)
Potassium: 2.9 mmol/L — ABNORMAL LOW (ref 3.5–5.1)
Sodium: 134 mmol/L — ABNORMAL LOW (ref 135–145)
Total Bilirubin: 0.2 mg/dL — ABNORMAL LOW (ref 0.3–1.2)
Total Protein: 5.1 g/dL — ABNORMAL LOW (ref 6.5–8.1)

## 2015-04-01 LAB — CBC WITH DIFFERENTIAL/PLATELET
BASOS PCT: 0 % (ref 0–1)
Basophils Absolute: 0 10*3/uL (ref 0.0–0.1)
Eosinophils Absolute: 0.4 10*3/uL (ref 0.0–0.7)
Eosinophils Relative: 4 % (ref 0–5)
HCT: 26.8 % — ABNORMAL LOW (ref 36.0–46.0)
HEMOGLOBIN: 8.9 g/dL — AB (ref 12.0–15.0)
Lymphocytes Relative: 15 % (ref 12–46)
Lymphs Abs: 1.5 10*3/uL (ref 0.7–4.0)
MCH: 29.2 pg (ref 26.0–34.0)
MCHC: 33.2 g/dL (ref 30.0–36.0)
MCV: 87.9 fL (ref 78.0–100.0)
MONO ABS: 0.6 10*3/uL (ref 0.1–1.0)
Monocytes Relative: 6 % (ref 3–12)
NEUTROS ABS: 7.5 10*3/uL (ref 1.7–7.7)
Neutrophils Relative %: 75 % (ref 43–77)
PLATELETS: 178 10*3/uL (ref 150–400)
RBC: 3.05 MIL/uL — AB (ref 3.87–5.11)
RDW: 14.3 % (ref 11.5–15.5)
WBC: 10 10*3/uL (ref 4.0–10.5)

## 2015-04-01 MED ORDER — SODIUM CHLORIDE 0.9 % IV SOLN
INTRAVENOUS | Status: DC
  Administered 2015-04-01 – 2015-04-02 (×2): via INTRAVENOUS
  Filled 2015-04-01 (×4): qty 1000

## 2015-04-01 MED ORDER — POTASSIUM CHLORIDE IN NACL 20-0.9 MEQ/L-% IV SOLN: INTRAVENOUS | Status: DC

## 2015-04-01 MED ORDER — OXYCODONE-ACETAMINOPHEN 5-325 MG PO TABS
2.0000 | ORAL_TABLET | ORAL | Status: DC | PRN
  Administered 2015-04-01: 2 via ORAL
  Administered 2015-04-01: 1 via ORAL
  Administered 2015-04-02: 2 via ORAL
  Filled 2015-04-01 (×3): qty 2

## 2015-04-01 NOTE — Progress Notes (Signed)
Late Entry, pt seen at 8 am  S: Pt notes continued R flank pain. Improved from admission but still notes being tender. Still w/ poor appetite and nausea. Needing IV Dilaudid and Percocet. Pt notes no CP, no SOB. Notes fatigue. States urine darker yellow this am as not drinking overnight. No stones passed (straining urine). No fevers overnight. Good FM, no LOF, no VB and active FM.  O: Filed Vitals:   04/01/15 1104 04/01/15 1400 04/01/15 1727 04/01/15 2130  BP:   Pulse:  73 69 71  Temp:  98.4 F (36.9 C) 98 F (36.7 C) 98.2 F (36.8 C)  TempSrc:  Oral Oral Oral  Resp: Height:      Weight:      SpO2: 100% 100% 100% 100%   Gen: tearful, frustrated, exhausted CV: RRR Pulm: CTAB Abd: soft, gravid, no fundal tenderness, no RUQ pain Back: R CVAT still present GU: deferred LE: trace edema.  CMP: K low, nl Cr Hgb: drop to 8.9  A/P: 34 yo G4P1 at 26'1 w/ R flank pain most c/w pyelonephritis though renal stone a possibility as no R ureteral jet and R mild hydro on initial u/s.  - pyelo, While initial UA not concerning, UCx w/ 50K GBS and multiple organisms, repeat preliminary UCx (after 24 hrs abx) negative but repeat UA c/w infection. Continue Ancef, if improved in am plan transition to Keflex for 14 days then Macrobid prophylaxis for pyelonephritis. While renal stone still in DDx, plan to continue to push IV and po hydration and strain urine. Continue Flomax. If pt not improved by tomorrow will repeat imaging, u/s to re-eval for R ureteral jet and assess change in hydronephrosis. If concern for obstructing stone will consult urology. Pt currently appears to be improving.  - Anemia. Expected w/ pyelo. D/w pt high liklihood of becoming anemia (prior to CBC result) and pt aware of plan to start iron as outpt.  - Hypokalemia, Will replete KCl and reassess in am  - Add daily NST.   Katheline Brendlinger A. 04/01/2015 10:27 PM

## 2015-04-02 LAB — URINE CULTURE: SPECIAL REQUESTS: NORMAL

## 2015-04-02 LAB — COMPREHENSIVE METABOLIC PANEL
ALK PHOS: 52 U/L (ref 38–126)
ALT: 9 U/L — AB (ref 14–54)
AST: 14 U/L — AB (ref 15–41)
Albumin: 2.3 g/dL — ABNORMAL LOW (ref 3.5–5.0)
CHLORIDE: 113 mmol/L — AB (ref 101–111)
CO2: 24 mmol/L (ref 22–32)
Calcium: 7.5 mg/dL — ABNORMAL LOW (ref 8.9–10.3)
Creatinine, Ser: 0.51 mg/dL (ref 0.44–1.00)
GFR calc Af Amer: >60 mL/min (ref >60)
GLUCOSE: 80 mg/dL (ref 65–99)
POTASSIUM: 3.5 mmol/L (ref 3.5–5.1)
Sodium: 136 mmol/L (ref 135–145)
TOTAL PROTEIN: 4.9 g/dL — AB (ref 6.5–8.1)
Total Bilirubin: 0.4 mg/dL (ref 0.3–1.2)

## 2015-04-02 MED ORDER — CEPHALEXIN 500 MG PO CAPS: 500.0000 mg | ORAL_CAPSULE | Freq: Two times a day (BID) | ORAL | Status: AC

## 2015-04-02 MED ORDER — OXYCODONE-ACETAMINOPHEN 5-325 MG PO TABS: 2.0000 | ORAL_TABLET | ORAL | Status: AC | PRN

## 2015-04-02 MED ORDER — DOCUSATE SODIUM 100 MG PO CAPS: 100.0000 mg | ORAL_CAPSULE | Freq: Two times a day (BID) | ORAL | Status: AC

## 2015-04-02 MED ORDER — ONDANSETRON 8 MG PO TBDP: 8.0000 mg | ORAL_TABLET | Freq: Two times a day (BID) | ORAL | Status: AC

## 2015-04-02 NOTE — Discharge Summary (Signed)
Patient ID: Diane Dalton MRN: [id number] DOB/AGE: [birth year] 34 y.o.  Admit date: 03/30/2015 Discharge date: 04/02/15  Admission Diagnoses: 25wks, Vomitting, Rt side Pain  Discharge Diagnoses: 25wks, Vomitting, Rt side Pain , Pyelonephritis, Anemia        Discharged Condition: stable  Hospital Course: Pt admitted with R flank pain and DDx of pyelonephritits vs renal stone. Pt given IVF, Ancef, Flomax, urine strainer, Zofran for nausea and Dilaudid PCA. On Zantac, SCDs and strict kick counts with qoday NST. By HD #4 pt notes only very minimal R flank pain, not needing narcotics for the past 12 hrs and no fevers or recent anti-emetic use. Pt notes no CP, no SOB. No ctx, no LOF, no VB, active FM.  O:  Filed Vitals:   04/02/15 0534  BP: 93/54  Pulse: 84  Temp: 97.6 F (36.4 C)  Resp: 18    Gen: well, smiling, no distress CV: RRR Pulm: CTAB, no crackles Abd: soft, gravid, NT Back: very minimal r CVAT GU: def LE: no edema  Consults: None and MFM  Treatments: IV hydration, antibiotics: Ancef and analgesia: Dilaudid and Percocet  Disposition: home     Medication List    STOP taking these medications        OCELLA 3-0.03 MG tablet  Generic drug:  drospirenone-ethinyl estradiol      TAKE these medications        aspirin 81 MG tablet  Take 81 mg by mouth daily.     cephALEXin 500 MG capsule  Commonly known as:  KEFLEX  Take 1 capsule (500 mg total) by mouth 2 (two) times daily.     docusate sodium 100 MG capsule  Commonly known as:  COLACE  Take 1 capsule (100 mg total) by mouth every 12 (twelve) hours.     folic acid 1 MG tablet  Commonly known as:  FOLVITE  Take 1 mg by mouth 2 (two) times daily.     ondansetron 8 MG disintegrating tablet  Commonly known as:  ZOFRAN ODT  Take 1 tablet (8 mg total) by mouth 2 (two) times daily.     oxyCODONE-acetaminophen 5-325 MG per tablet  Commonly known as:  PERCOCET/ROXICET  Take 2 tablets by mouth every 4 (four) hours as  needed for moderate pain.     prenatal multivitamin Tabs tablet  Take 1 tablet by mouth daily.     ranitidine 150 MG tablet  Commonly known as:  ZANTAC  Take 150 mg by mouth 2 (two) times daily.       Also take OTC Miralax and iron pill from Dr. Aurea Graff office  Signed: Lendon Colonel., MD MD 04/02/2015, 8:39 AM

## 2015-04-02 NOTE — Progress Notes (Signed)
Pt reports good fetal movement.  Good fetal movement audible via external [place].  Instructed to call office with decreased fetal movement, bleeding, increased pain.  Keep appt with Dr Elpidio Eric office next Wednesday.

## 2015-04-02 NOTE — Progress Notes (Signed)
Patient discharged home with husband... Discharge instructions reviewed with patient and she verbalized understanding... NST done prior to discharge, and no concerns noted by L&D RN... Condition stable... No equipment... Ambulated to car with Selinda Michaels, RN.

## 2015-04-11 ENCOUNTER — Inpatient Hospital Stay (HOSPITAL_COMMUNITY)
Admission: AD | Admit: 2015-04-11 | Payer: Federal, State, Local not specified - PPO | Source: Ambulatory Visit | Admitting: Obstetrics

## 2015-06-18 ENCOUNTER — Other Ambulatory Visit: Payer: Self-pay | Admitting: Obstetrics

## 2015-06-25 ENCOUNTER — Telehealth (HOSPITAL_COMMUNITY): Payer: Self-pay | Admitting: *Deleted

## 2015-06-25 ENCOUNTER — Encounter (HOSPITAL_COMMUNITY): Payer: Self-pay | Admitting: *Deleted

## 2015-06-25 NOTE — Telephone Encounter (Signed)
Preadmission screen  

## 2015-07-01 ENCOUNTER — Inpatient Hospital Stay (HOSPITAL_COMMUNITY): Payer: Federal, State, Local not specified - PPO | Admitting: Anesthesiology

## 2015-07-01 ENCOUNTER — Encounter (HOSPITAL_COMMUNITY): Payer: Self-pay

## 2015-07-01 ENCOUNTER — Inpatient Hospital Stay (HOSPITAL_COMMUNITY)
Admission: RE | Admit: 2015-07-01 | Discharge: 2015-07-03 | DRG: 775 | Disposition: A | Discharge status: Home / Self Care | Payer: Federal, State, Local not specified - PPO | Source: Ambulatory Visit | Attending: Obstetrics | Admitting: Obstetrics

## 2015-07-01 VITALS — BP 117/68 | HR 57 | Temp 98.2°F | Resp 18 | Ht 63.0 in | Wt 137.0 lb

## 2015-07-01 DIAGNOSIS — K589 Irritable bowel syndrome without diarrhea: Secondary | ICD-10-CM | POA: Diagnosis present

## 2015-07-01 DIAGNOSIS — O9962 Diseases of the digestive system complicating childbirth: Secondary | ICD-10-CM | POA: Diagnosis present

## 2015-07-01 DIAGNOSIS — D649 Anemia, unspecified: Secondary | ICD-10-CM | POA: Diagnosis present

## 2015-07-01 DIAGNOSIS — K219 Gastro-esophageal reflux disease without esophagitis: Secondary | ICD-10-CM | POA: Diagnosis present

## 2015-07-01 DIAGNOSIS — O9902 Anemia complicating childbirth: Principal | ICD-10-CM | POA: Diagnosis present

## 2015-07-01 DIAGNOSIS — Z3A39 39 weeks gestation of pregnancy: Secondary | ICD-10-CM

## 2015-07-01 DIAGNOSIS — Z833 Family history of diabetes mellitus: Secondary | ICD-10-CM | POA: Diagnosis not present

## 2015-07-01 DIAGNOSIS — O99824 Streptococcus B carrier state complicating childbirth: Secondary | ICD-10-CM | POA: Diagnosis present

## 2015-07-01 DIAGNOSIS — R109 Unspecified abdominal pain: Secondary | ICD-10-CM

## 2015-07-01 DIAGNOSIS — O26899 Other specified pregnancy related conditions, unspecified trimester: Secondary | ICD-10-CM

## 2015-07-01 DIAGNOSIS — N1330 Unspecified hydronephrosis: Secondary | ICD-10-CM

## 2015-07-01 DIAGNOSIS — Z3490 Encounter for supervision of normal pregnancy, unspecified, unspecified trimester: Secondary | ICD-10-CM

## 2015-07-01 LAB — CBC WITH DIFFERENTIAL/PLATELET
BASOS ABS: 0 10*3/uL (ref 0.0–0.1)
Basophils Relative: 0 %
EOS ABS: 0.2 10*3/uL (ref 0.0–0.7)
EOS PCT: 2 %
HCT: 34.1 % — ABNORMAL LOW (ref 36.0–46.0)
Hemoglobin: 11.4 g/dL — ABNORMAL LOW (ref 12.0–15.0)
Lymphocytes Relative: 16 %
Lymphs Abs: 1.9 10*3/uL (ref 0.7–4.0)
MCH: 29.4 pg (ref 26.0–34.0)
MCHC: 33.4 g/dL (ref 30.0–36.0)
MCV: 87.9 fL (ref 78.0–100.0)
MONO ABS: 0.9 10*3/uL (ref 0.1–1.0)
Monocytes Relative: 7 %
NEUTROS PCT: 75 %
Neutro Abs: 8.9 10*3/uL — ABNORMAL HIGH (ref 1.7–7.7)
PLATELETS: 168 10*3/uL (ref 150–400)
RBC: 3.88 MIL/uL (ref 3.87–5.11)
RDW: 15 % (ref 11.5–15.5)
WBC: 12 10*3/uL — AB (ref 4.0–10.5)

## 2015-07-01 LAB — CBC
HCT: 34.5 % — ABNORMAL LOW (ref 36.0–46.0)
Hemoglobin: 11.7 g/dL — ABNORMAL LOW (ref 12.0–15.0)
MCH: 29.6 pg (ref 26.0–34.0)
MCHC: 33.9 g/dL (ref 30.0–36.0)
MCV: 87.3 fL (ref 78.0–100.0)
PLATELETS: 173 10*3/uL (ref 150–400)
RBC: 3.95 MIL/uL (ref 3.87–5.11)
RDW: 14.7 % (ref 11.5–15.5)
WBC: 10.1 10*3/uL (ref 4.0–10.5)

## 2015-07-01 LAB — PROTIME-INR
INR: 0.99 (ref 0.00–1.49)
PROTHROMBIN TIME: 13.3 s (ref 11.6–15.2)

## 2015-07-01 LAB — ABO/RH: ABO/RH(D): B POS

## 2015-07-01 LAB — APTT: APTT: 28 s (ref 24–37)

## 2015-07-01 LAB — TYPE AND SCREEN
ABO/RH(D): B POS
ANTIBODY SCREEN: NEGATIVE

## 2015-07-01 LAB — RPR: RPR: NONREACTIVE

## 2015-07-01 LAB — FIBRINOGEN: FIBRINOGEN: 408 mg/dL (ref 204–475)

## 2015-07-01 MED ORDER — FLEET ENEMA 7-19 GM/118ML RE ENEM: 1.0000 | ENEMA | RECTAL | Status: DC | PRN

## 2015-07-01 MED ORDER — PENICILLIN G POTASSIUM 5000000 UNITS IJ SOLR
5.0000 10*6.[IU] | Freq: Once | INTRAMUSCULAR | Status: AC
  Administered 2015-07-01: 5 10*6.[IU] via INTRAVENOUS
  Filled 2015-07-01: qty 5

## 2015-07-01 MED ORDER — FENTANYL 2.5 MCG/ML BUPIVACAINE 1/10 % EPIDURAL INFUSION (WH - ANES)
14.0000 mL/h | INTRAMUSCULAR | Status: DC | PRN
  Administered 2015-07-01 (×2): 14 mL/h via EPIDURAL
  Filled 2015-07-01 (×2): qty 125

## 2015-07-01 MED ORDER — TERBUTALINE SULFATE 1 MG/ML IJ SOLN
0.2500 mg | Freq: Once | INTRAMUSCULAR | Status: DC | PRN
  Filled 2015-07-01: qty 1

## 2015-07-01 MED ORDER — DIPHENHYDRAMINE HCL 50 MG/ML IJ SOLN: 12.5000 mg | INTRAMUSCULAR | Status: DC | PRN

## 2015-07-01 MED ORDER — ACETAMINOPHEN 325 MG PO TABS: 650.0000 mg | ORAL_TABLET | ORAL | Status: DC | PRN

## 2015-07-01 MED ORDER — PENICILLIN G POTASSIUM 5000000 UNITS IJ SOLR
2.5000 10*6.[IU] | INTRAMUSCULAR | Status: DC
  Administered 2015-07-01 (×3): 2.5 10*6.[IU] via INTRAVENOUS
  Filled 2015-07-01 (×8): qty 2.5

## 2015-07-01 MED ORDER — LACTATED RINGERS IV SOLN: 500.0000 mL | INTRAVENOUS | Status: DC | PRN

## 2015-07-01 MED ORDER — OXYTOCIN BOLUS FROM INFUSION
500.0000 mL | INTRAVENOUS | Status: DC
  Administered 2015-07-01: 500 mL via INTRAVENOUS

## 2015-07-01 MED ORDER — LIDOCAINE HCL (PF) 1 % IJ SOLN
INTRAMUSCULAR | Status: DC | PRN
  Administered 2015-07-01 (×2): 4 mL via EPIDURAL

## 2015-07-01 MED ORDER — OXYTOCIN 40 UNITS IN LACTATED RINGERS INFUSION - SIMPLE MED
1.0000 m[IU]/min | INTRAVENOUS | Status: DC
  Administered 2015-07-01: 14 m[IU]/min via INTRAVENOUS
  Administered 2015-07-01: 6 m[IU]/min via INTRAVENOUS
  Administered 2015-07-01: 4 m[IU]/min via INTRAVENOUS
  Administered 2015-07-01: 12 m[IU]/min via INTRAVENOUS
  Administered 2015-07-01: 2 m[IU]/min via INTRAVENOUS
  Administered 2015-07-01: 16 m[IU]/min via INTRAVENOUS
  Administered 2015-07-01: 10 m[IU]/min via INTRAVENOUS
  Administered 2015-07-01: 8 m[IU]/min via INTRAVENOUS
  Filled 2015-07-01: qty 1000

## 2015-07-01 MED ORDER — ONDANSETRON HCL 4 MG/2ML IJ SOLN
4.0000 mg | Freq: Four times a day (QID) | INTRAMUSCULAR | Status: DC | PRN
  Administered 2015-07-01 (×2): 4 mg via INTRAVENOUS
  Filled 2015-07-01 (×2): qty 2

## 2015-07-01 MED ORDER — PHENYLEPHRINE 40 MCG/ML (10ML) SYRINGE FOR IV PUSH (FOR BLOOD PRESSURE SUPPORT)
80.0000 ug | PREFILLED_SYRINGE | INTRAVENOUS | Status: DC | PRN
  Filled 2015-07-01: qty 2
  Filled 2015-07-01: qty 20

## 2015-07-01 MED ORDER — EPHEDRINE 5 MG/ML INJ
10.0000 mg | INTRAVENOUS | Status: DC | PRN
  Filled 2015-07-01: qty 2

## 2015-07-01 MED ORDER — OXYCODONE-ACETAMINOPHEN 5-325 MG PO TABS: 1.0000 | ORAL_TABLET | ORAL | Status: DC | PRN

## 2015-07-01 MED ORDER — OXYTOCIN 40 UNITS IN LACTATED RINGERS INFUSION - SIMPLE MED: 62.5000 mL/h | INTRAVENOUS | Status: DC

## 2015-07-01 MED ORDER — CITRIC ACID-SODIUM CITRATE 334-500 MG/5ML PO SOLN
30.0000 mL | ORAL | Status: DC | PRN
  Filled 2015-07-01: qty 15

## 2015-07-01 MED ORDER — OXYCODONE-ACETAMINOPHEN 5-325 MG PO TABS
2.0000 | ORAL_TABLET | ORAL | Status: DC | PRN
Start: 2015-07-01 — End: 2015-07-02

## 2015-07-01 MED ORDER — LIDOCAINE HCL (PF) 1 % IJ SOLN
30.0000 mL | INTRAMUSCULAR | Status: DC | PRN
  Filled 2015-07-01: qty 30

## 2015-07-01 MED ORDER — LACTATED RINGERS IV SOLN
INTRAVENOUS | Status: DC
  Administered 2015-07-01 (×3): 950 mL via INTRAVENOUS

## 2015-07-01 NOTE — Progress Notes (Signed)
Dr Ernestina PennaFogleman coming to  Confirm vertex with U/S

## 2015-07-01 NOTE — Progress Notes (Signed)
Notified Dr Ernestina PennaFogleman of cervical exam,  Orders to begin to increase pitocin

## 2015-07-01 NOTE — Progress Notes (Signed)
Called Dr Lavon PaganiniFoglerman, informed her of drop in baseline, she is coming over.

## 2015-07-01 NOTE — Progress Notes (Signed)
S: CTSP for decrease in baseline and increased bloody d/c  Doing well, no complaints, pain well controlled with epidural  O: BP 108/62 mmHg  Pulse 65  Temp(Src) 97.9 F (36.6 C) (Oral)  Ht 5\' 3"  (1.6 m)  Wt 137 lb (62.143 kg)  BMI 24.27 kg/m2  SpO2 100%   FHT:  FHR: 110 bpm, variability: moderate,  accelerations:  Present,  decelerations:  Absent b/l at 130s this am, drop to 110s but with accels and always with scalp stim to 120-130s UC:   regular, every 3 minutes SVE:   Dilation: 4 Effacement (%): 70 Station: -1 Exam by:: Dherr rn IUPC placed, FSE place More than usual blood tinge to amniotic fluid  A / P:  34 y.o.  Obstetric History   G4   P1   T0   P1   A2   TAB0   SAB2   E0   M0   L1    at 3964w1d elective IOL after reviewing R/B IOL. h/o pyelo. baseline drop in FH, will cont to watch closely, clinical suspician of placental abruption, OK for cautious attempt at SVD given reactive fetal testing.   Fetal Wellbeing:  Category I Pain Control:  Epidural  Anticipated MOD:  NSVD  Dezyre Hoefer A. 07/01/2015, 4:03 PM

## 2015-07-01 NOTE — Anesthesia Procedure Notes (Signed)
Epidural Patient location during procedure: OB Start time: 07/01/2015 11:51 AM End time: 07/01/2015 11:59 AM  Staffing Anesthesiologist: Shona SimpsonHOLLIS, Koral Thaden D Performed by: anesthesiologist   Preanesthetic Checklist Completed: patient identified, site marked, surgical consent, pre-op evaluation, timeout performed, IV checked, risks and benefits discussed and monitors and equipment checked  Epidural Patient position: sitting Prep: DuraPrep Patient monitoring: heart rate, continuous pulse ox and blood pressure Approach: midline Location: L4-L5 Injection technique: LOR saline  Needle:  Needle type: Tuohy  Needle gauge: 18 G Needle length: 9 cm and 9 Catheter type: closed end flexible Catheter size: 20 Guage Test dose: negative and Other  Assessment Events: blood not aspirated, injection not painful, no injection resistance, negative IV test and no paresthesia  Additional Notes LOR @ 6  Patient identified. Risks/Benefits/Options discussed with patient including but not limited to bleeding, infection, nerve damage, paralysis, failed block, incomplete pain control, headache, blood pressure changes, nausea, vomiting, reactions to medications, itching and postpartum back pain. Confirmed with bedside nurse the patient's most recent platelet count. Confirmed with patient that they are not currently taking any anticoagulation, have any bleeding history or any family history of bleeding disorders. Patient expressed understanding and wished to proceed. All questions were answered. Sterile technique was used throughout the entire procedure. Please see nursing notes for vital signs. Test dose was given through epidural catheter and negative prior to continuing to dose epidural or start infusion. Warning signs of high block given to the patient including shortness of breath, tingling/numbness in hands, complete motor block, or any concerning symptoms with instructions to call for help. Patient was given  instructions on fall risk and not to get out of bed. All questions and concerns addressed with instructions to call with any issues or inadequate analgesia.      Patient tolerated the insertion well without complications.Reason for block:procedure for pain

## 2015-07-01 NOTE — Progress Notes (Signed)
Zofran IVP

## 2015-07-01 NOTE — H&P (Signed)
Diane Dalton is a 34 y.o. [phone] at 6178w1d presenting for elective, term IOL. Pt notes rare contractions. Good fetal movement, No vaginal bleeding, not leaking fluid.  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 On baby ASA, prometrium and folic acid in first trimester - h/o pTD at 36 wks, declined 17-P -Pyelonephritisi in preg, needed admission, remained on Keflex and/or Macrobid for the remainder of preg but at times had recurrent UTI - GBS bacteruria. PCN in labor - Anemia, on iron - Growth u/s, nl at 36 wks   Prenatal Transfer Tool  Maternal Diabetes: No Genetic Screening: Declined Maternal Ultrasounds/Referrals: Normal Fetal Ultrasounds or other Referrals:  None Maternal Substance Abuse:  No Significant Maternal Medications:  None Significant Maternal Lab Results: None     OB History    Gravida Para Term Preterm AB TAB SAB Ectopic Multiple Living   4 1 0 1 2 0 2 0 0 1      Past Medical History  Diagnosis Date  . Spondylitis, ankylosing (HCC)   . IBS (irritable bowel syndrome)   . Dysrhythmia   . Pregnancy with flank pain, antepartum 03/30/2015  . Hydronephrosis, right 03/30/2015  . MTHFR mutation (HCC)   . GERD (gastroesophageal reflux disease)   . Anemia   . History of pyelonephritis during pregnancy   . Hx of varicella    Past Surgical History  Procedure Laterality Date  . Dilation and curettage of uterus     Family History: family history includes Bipolar disorder in her father and sister; Diabetes in her father and sister; Rheum arthritis in her mother. There is no history of Anesthesia problems. Social History:  reports that she has never smoked. She has never used smokeless tobacco. She reports that she does not drink alcohol or use illicit drugs.  Review of Systems - Negative except discomfort of pregnancy     Filed Vitals:   07/01/15 0721  BP: 92/64  Pulse: 77  Temp: 97.9 F  (36.6 C)  TempSrc: Oral  Height: 5\' 3"  (1.6 m)  Weight: 137 lb (62.143 kg)    Prenatal labs: ABO, Rh: B/Positive/-- (04/18 0000) Antibody: Negative (04/18 0000) Rubella: !Error! RPR: Nonreactive (04/18 0000)  HBsAg: Negative (04/18 0000)  HIV: Non-reactive (04/18 0000)  GBS: Positive (04/18 0000)  1 hr Glucola notmal  Genetic screening declined, nl AFP Anatomy US nl   Assessment/Plan: 34 y.o. [id number] at 5978w1d Elective IOL. Plan pit then AROM when able. Pt understands risks of IOL GBS pos, PCN   Jeromiah Ohalloran A. 07/01/2015, 7:55 AM

## 2015-07-01 NOTE — Progress Notes (Signed)
This note also relates to the following rows which could not be included: BP - Cannot attach notes to unvalidated device data Pulse Rate - Cannot attach notes to unvalidated device data

## 2015-07-01 NOTE — Anesthesia Preprocedure Evaluation (Addendum)
Anesthesia Evaluation  Patient identified by MRN, date of birth, ID band Patient awake    Reviewed: Allergy & Precautions, NPO status , Patient's Chart, lab work & pertinent test results  Airway Mallampati: II  TM Distance: >3 FB Neck ROM: Full    Dental  (+) Teeth Intact   Pulmonary neg pulmonary ROS,    breath sounds clear to auscultation       Cardiovascular + dysrhythmias  Rhythm:Regular Rate:Normal     Neuro/Psych negative neurological ROS  negative psych ROS   GI/Hepatic Neg liver ROS, GERD  Medicated,  Endo/Other    Renal/GU Renal InsufficiencyRenal disease  negative genitourinary   Musculoskeletal negative musculoskeletal ROS (+)   Abdominal   Peds negative pediatric ROS (+)  Hematology negative hematology ROS (+)   Anesthesia Other Findings MTHFR Mutation  Reproductive/Obstetrics (+) Pregnancy                            Lab Results  Component Value Date   WBC 10.1 07/01/2015   HGB 11.7* 07/01/2015   HCT 34.5* 07/01/2015   MCV 87.3 07/01/2015   PLT 173 07/01/2015   No results found for: INR, PROTIME   Anesthesia Physical Anesthesia Plan  ASA: III  Anesthesia Plan: Epidural   Post-op Pain Management:    Induction:   Airway Management Planned:   Additional Equipment:   Intra-op Plan:   Post-operative Plan:   Informed Consent: I have reviewed the patients History and Physical, chart, labs and discussed the procedure including the risks, benefits and alternatives for the proposed anesthesia with the patient or authorized representative who has indicated his/her understanding and acceptance.     Plan Discussed with:   Anesthesia Plan Comments:         Anesthesia Quick Evaluation

## 2015-07-01 NOTE — Progress Notes (Signed)
S: Doing well, no complaints, pain well controlled with epidural just placed  O: BP 112/71 mmHg  Pulse 75  Temp(Src) 97.9 F (36.6 C) (Oral)  Ht 5\' 3"  (1.6 m)  Wt 137 lb (62.143 kg)  BMI 24.27 kg/m2  SpO2 100%   FHT:  FHR: 130s bpm, variability: moderate,  accelerations:  Present,  decelerations:  Absent UC:   regular, every 3 minutes SVE:   Dilation: 2.5 Effacement (%): 70 Station: -2 Exam by:: D Herr rn Repeat exam by me: 3/50%/vtx -1, AROM pink tinge  CBC    Component Value Date/Time   WBC 10.1 07/01/2015 0730   RBC 3.95 07/01/2015 0730   HGB 11.7* 07/01/2015 0730   HCT 34.5* 07/01/2015 0730   PLT 173 07/01/2015 0730   MCV 87.3 07/01/2015 0730   MCH 29.6 07/01/2015 0730   MCHC 33.9 07/01/2015 0730   RDW 14.7 07/01/2015 0730   LYMPHSABS 1.5 04/01/2015 0931   MONOABS 0.6 04/01/2015 0931   EOSABS 0.4 04/01/2015 0931   BASOSABS 0.0 04/01/2015 0931      A / P:  34 y.o.  Obstetric History   G4   P1   T0   P1   A2   TAB0   SAB2   E0   M0   L1    at 919w1d Elective IOL, pit, now AROM, 39 wks  Fetal Wellbeing:  Category I Pain Control:  Epidural  Anticipated MOD:  NSVD  Maeson Purohit A. 07/01/2015, 1:51 PM

## 2015-07-02 ENCOUNTER — Encounter (HOSPITAL_COMMUNITY): Payer: Self-pay

## 2015-07-02 MED ORDER — WITCH HAZEL-GLYCERIN EX PADS: 1.0000 "application " | MEDICATED_PAD | CUTANEOUS | Status: DC | PRN

## 2015-07-02 MED ORDER — DIPHENHYDRAMINE HCL 25 MG PO CAPS: 25.0000 mg | ORAL_CAPSULE | Freq: Four times a day (QID) | ORAL | Status: DC | PRN

## 2015-07-02 MED ORDER — ONDANSETRON HCL 4 MG PO TABS: 4.0000 mg | ORAL_TABLET | ORAL | Status: DC | PRN

## 2015-07-02 MED ORDER — IBUPROFEN 600 MG PO TABS
600.0000 mg | ORAL_TABLET | Freq: Four times a day (QID) | ORAL | Status: DC
  Administered 2015-07-02 – 2015-07-03 (×5): 600 mg via ORAL
  Filled 2015-07-02 (×5): qty 1

## 2015-07-02 MED ORDER — DIBUCAINE 1 % RE OINT: 1.0000 "application " | TOPICAL_OINTMENT | RECTAL | Status: DC | PRN

## 2015-07-02 MED ORDER — SENNOSIDES-DOCUSATE SODIUM 8.6-50 MG PO TABS
2.0000 | ORAL_TABLET | ORAL | Status: DC
  Administered 2015-07-02: 2 via ORAL
  Filled 2015-07-02: qty 2

## 2015-07-02 MED ORDER — BENZOCAINE-MENTHOL 20-0.5 % EX AERO: 1.0000 "application " | INHALATION_SPRAY | CUTANEOUS | Status: DC | PRN

## 2015-07-02 MED ORDER — ACETAMINOPHEN 325 MG PO TABS
650.0000 mg | ORAL_TABLET | ORAL | Status: DC | PRN
  Administered 2015-07-02 (×2): 650 mg via ORAL
  Filled 2015-07-02 (×2): qty 2

## 2015-07-02 MED ORDER — TETANUS-DIPHTH-ACELL PERTUSSIS 5-2.5-18.5 LF-MCG/0.5 IM SUSP: 0.5000 mL | Freq: Once | INTRAMUSCULAR | Status: DC

## 2015-07-02 MED ORDER — OXYCODONE-ACETAMINOPHEN 5-325 MG PO TABS: 2.0000 | ORAL_TABLET | ORAL | Status: DC | PRN

## 2015-07-02 MED ORDER — OXYCODONE-ACETAMINOPHEN 5-325 MG PO TABS: 1.0000 | ORAL_TABLET | ORAL | Status: DC | PRN

## 2015-07-02 MED ORDER — CEPHALEXIN 500 MG PO CAPS
500.0000 mg | ORAL_CAPSULE | Freq: Every day | ORAL | Status: DC
  Administered 2015-07-02 – 2015-07-03 (×2): 500 mg via ORAL
  Filled 2015-07-02 (×3): qty 1

## 2015-07-02 MED ORDER — LANOLIN HYDROUS EX OINT: TOPICAL_OINTMENT | CUTANEOUS | Status: DC | PRN

## 2015-07-02 MED ORDER — ONDANSETRON HCL 4 MG/2ML IJ SOLN: 4.0000 mg | INTRAMUSCULAR | Status: DC | PRN

## 2015-07-02 MED ORDER — PRENATAL MULTIVITAMIN CH
1.0000 | ORAL_TABLET | Freq: Every day | ORAL | Status: DC
  Administered 2015-07-02: 1 via ORAL
  Filled 2015-07-02: qty 1

## 2015-07-02 MED ORDER — ZOLPIDEM TARTRATE 5 MG PO TABS: 5.0000 mg | ORAL_TABLET | Freq: Every evening | ORAL | Status: DC | PRN

## 2015-07-02 MED ORDER — SIMETHICONE 80 MG PO CHEW: 80.0000 mg | CHEWABLE_TABLET | ORAL | Status: DC | PRN

## 2015-07-02 NOTE — Anesthesia Postprocedure Evaluation (Signed)
Anesthesia Post Note  Patient: Diane Dalton  Procedure(s) Performed: * No procedures listed *  Anesthesia type: Epidural  Patient location: Mother/Baby  Post pain: Pain level controlled  Post assessment: Post-op Vital signs reviewed  Last Vitals:  Filed Vitals:   07/02/15 0610  BP: 115/63  Pulse: 61  Temp: 36.9 C  Resp: 18    Post vital signs: Reviewed  Level of consciousness:alert  Complications: No apparent anesthesia complications

## 2015-07-02 NOTE — Progress Notes (Signed)
Patient ID: Diane Dalton, female   DOB: 30-Oct-1980, 34 y.o.   MRN: [id number] PPD # 1 SVD  S:  Reports feeling well             Tolerating po/ No nausea or vomiting             Bleeding is light             Pain controlled with ibuprofen (OTC)             Up ad lib / ambulatory / voiding without difficulties    Newborn  Information for the patient's newborn:  Lelan Ponsripp, Girl Edessa [[id number]][[id number]]  female  breast feeding     O:  A & O x 3, in no apparent distress              VS:  Filed Vitals:   07/02/15 0000 07/02/15 0045 07/02/15 0200 07/02/15 0610  BP: 126/68 119/71 101/57 115/63  Pulse: 73 79 72 61  Temp:  99 F (37.2 C) 99.7 F (37.6 C) 98.5 F (36.9 C)  TempSrc:  Oral Oral Oral  Resp: 18 18 18 18   Height:      Weight:      SpO2:  100%      LABS:  Recent Labs  07/01/15 0730 07/01/15 1604  WBC 10.1 12.0*  HGB 11.7* 11.4*  HCT 34.5* 34.1*  PLT 173 168    Blood type: --/--/B POS, B POS (11/01 0730)  Rubella: Immune (04/18 0000)   I&O: I/O last 3 completed shifts: In: -  Out: 607 [Urine:525; Blood:82]             Lungs: Clear and unlabored  Heart: regular rate and rhythm / no murmurs  Abdomen: soft, non-tender, non-distended             Fundus: firm, non-tender, U-2  Perineum: intact  Lochia: minimal  Extremities: No edema, no calf pain or tenderness, No Homans    A/P: PPD # 1  34 y.o., [id number]   Principal Problem:    Postpartum care following vaginal delivery (11/1)    Doing well - stable status  Routine post partum orders  Anticipate discharge tomorrow    Raelyn MoraAWSON, Kathye Cipriani, M, MSN, CNM 07/02/2015, 12:15 PM

## 2015-07-03 MED ORDER — IBUPROFEN 800 MG PO TABS: 800.0000 mg | ORAL_TABLET | Freq: Three times a day (TID) | ORAL | Status: AC | PRN

## 2015-07-03 NOTE — Discharge Summary (Signed)
Obstetric Discharge Summary  Reason for Admission: induction of labor - elective Prenatal Procedures: none Intrapartum Procedures: spontaneous vaginal delivery and GBS prophylaxis Postpartum Procedures: none Complications-Operative and Postpartum: none HEMOGLOBIN  Date Value Ref Range Status  07/01/2015 11.4* 12.0 - 15.0 g/dL Final   HCT  Date Value Ref Range Status  07/01/2015 34.1* 36.0 - 46.0 % Final    Physical Exam:  General: alert, cooperative and no distress Lochia: appropriate Uterine Fundus: firm DVT Evaluation: No evidence of DVT seen on physical exam.  Discharge Diagnoses: Term Pregnancy-delivered  Discharge Information: Date: 07/03/2015 Activity: pelvic rest Diet: routine Medications: PNV and Ibuprofen Condition: stable Instructions: refer to practice specific booklet Discharge to: home Follow-up Information    [facility] Schedule an appointment as soon as possible for a visit in 6 weeks.   Specialty:  Obstetrics and Gynecology   Contact information:   [address] [place] [id number] [phone]       Newborn Data: Live born female  Birth Weight: 6 lb 1.2 oz (2755 g) APGAR: 9, 9  Home with mother.  Diane Dalton 07/03/2015, 10:34 AM

## 2015-07-03 NOTE — Progress Notes (Signed)
PPD 2 SVD  S:  Reports feeling well - ready to go home             Tolerating po/ No nausea or vomiting             Bleeding is light             Pain controlled with motrin             Up ad lib / ambulatory / voiding QS  Newborn breast feeding    O:               VS: BP 117/68 mmHg  Pulse 57  Temp(Src) 98.2 F (36.8 C) (Oral)  Resp 18  Ht 5\' 3"  (1.6 m)  Wt 62.143 kg (137 lb)  BMI 24.27 kg/m2  SpO2 100%  Breastfeeding? Unknown   LABS:              Recent Labs  07/01/15 0730 07/01/15 1604  WBC 10.1 12.0*  HGB 11.7* 11.4*  PLT 173 168               Blood type: --/--/B POS, B POS (11/01 0730)  Rubella: Immune (04/18 0000)                     I&O: Intake/Output      11/02 0701 - 11/03 0700 11/03 0701 - 11/04 0700   Urine (mL/kg/hr)     Blood     Total Output       Net                          Physical Exam:             Alert and oriented X3  Abdomen: soft, non-tender, non-distended              Fundus: firm, non-tender, U-1  Perineum: no edema  Lochia: light  Extremities: no edema, no calf pain or tenderness    A: PPD # 2   Doing well - stable status  P: Routine post partum orders  DC home  Diane Dalton, Carrell Rahmani CNM, MSN, [facility] 07/03/2015, 10:31 AM

## 2017-02-12 IMAGING — US US RENAL
1 series · 14 of 25 positions shown · non-contrast
Comparison: None

CLINICAL DATA: 34-year-old pregnant female with acute right flank
pain for 10 hours.

EXAM:
RENAL / URINARY TRACT ULTRASOUND COMPLETE

[Series 1: us renal · 14 of 42 slices shown]
[im 1/42]
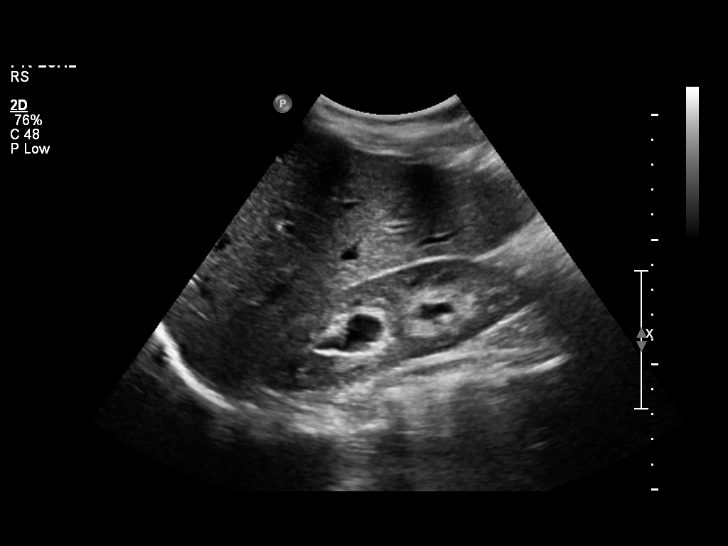
[im 4/42]
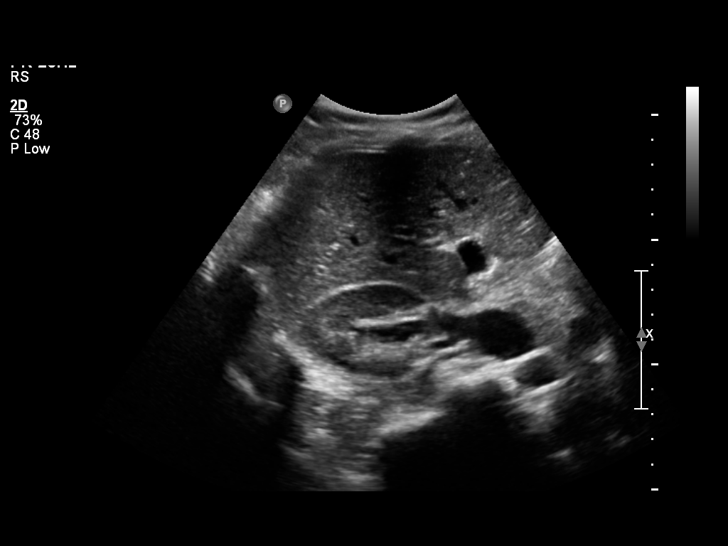
[im 7/42]
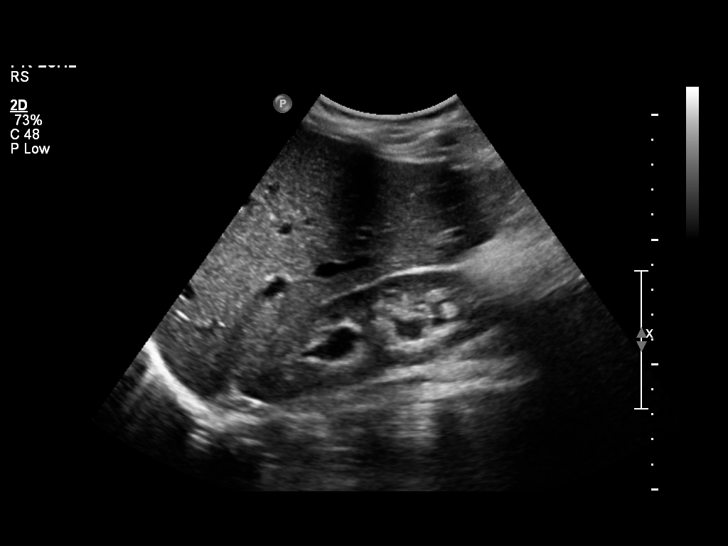
[im 11/42]
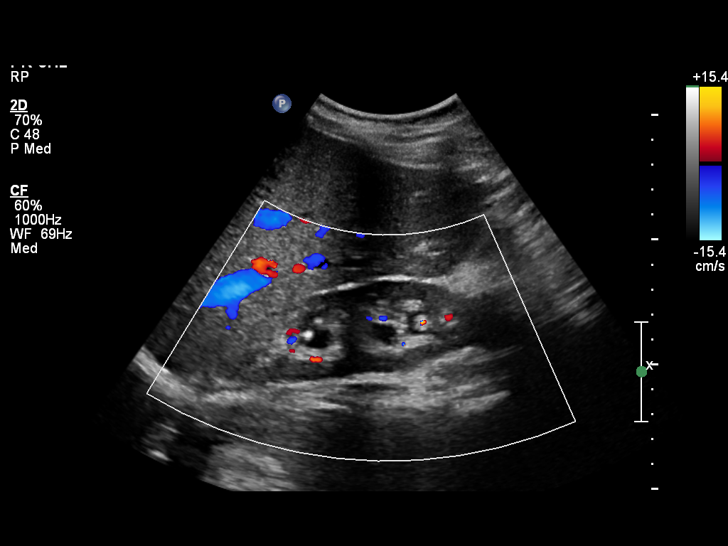
[im 14/42]
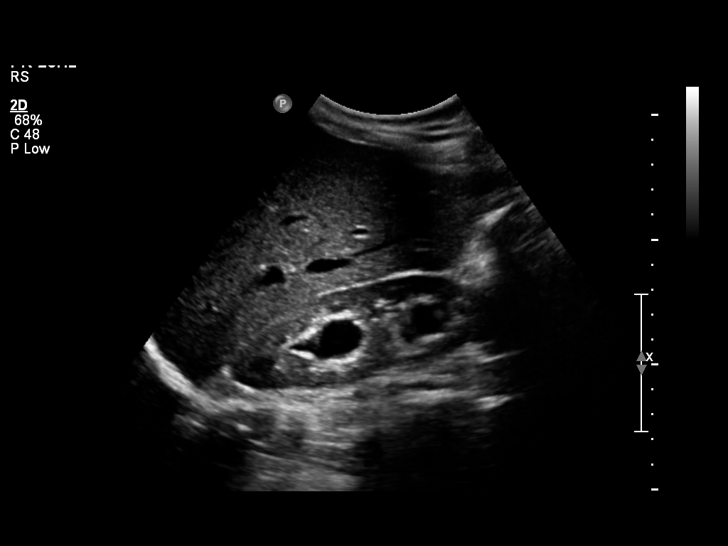
[im 16/42]
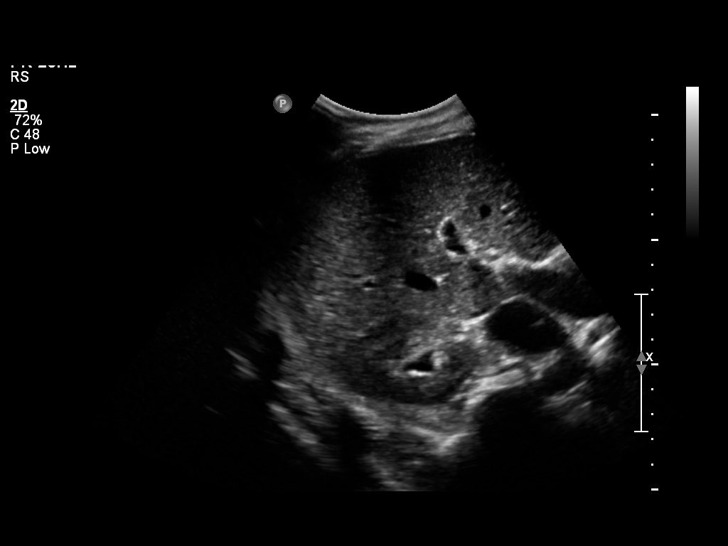
[im 19/42]
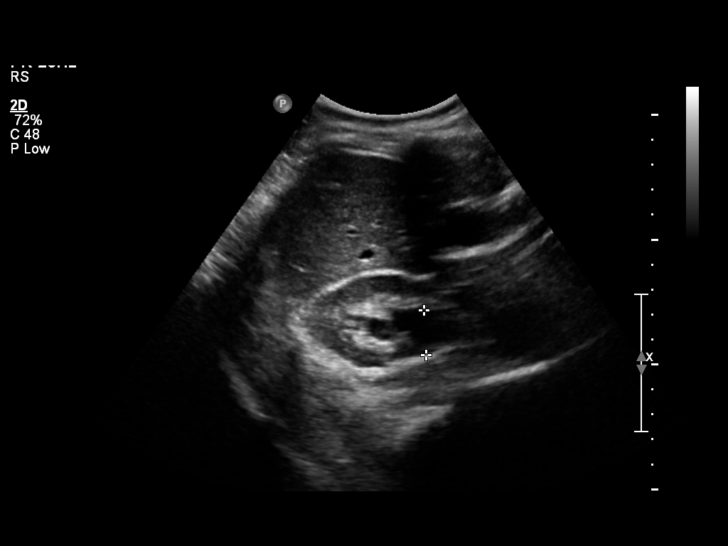
[im 23/42]
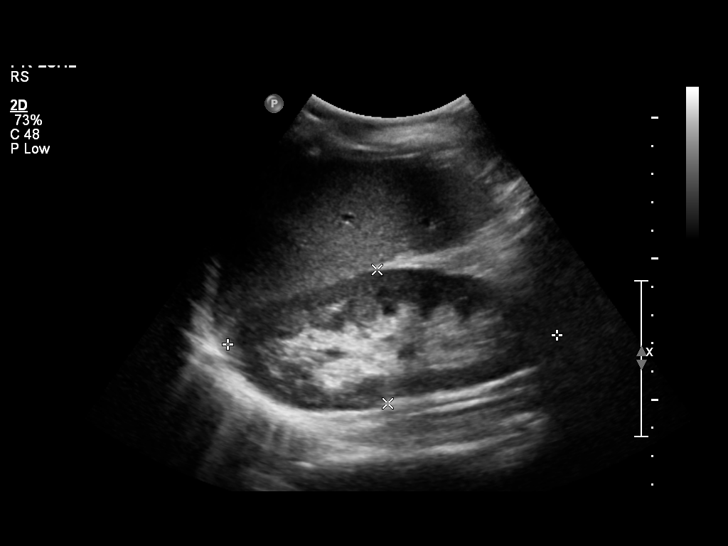
[im 26/42]
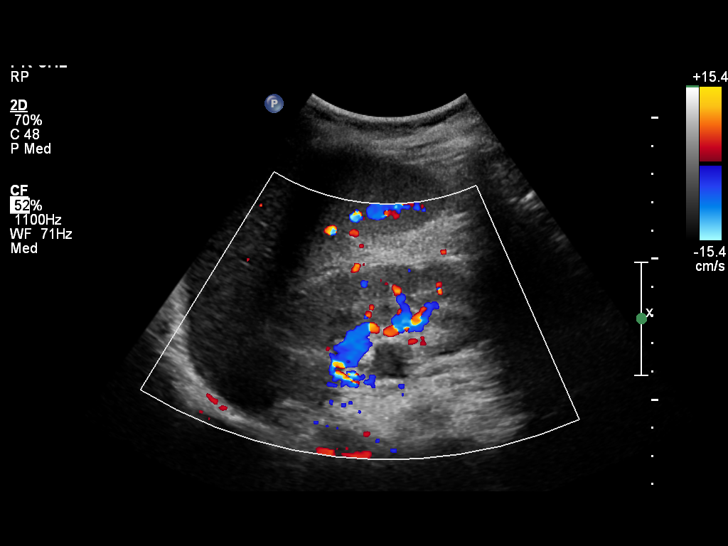
[im 28/42]
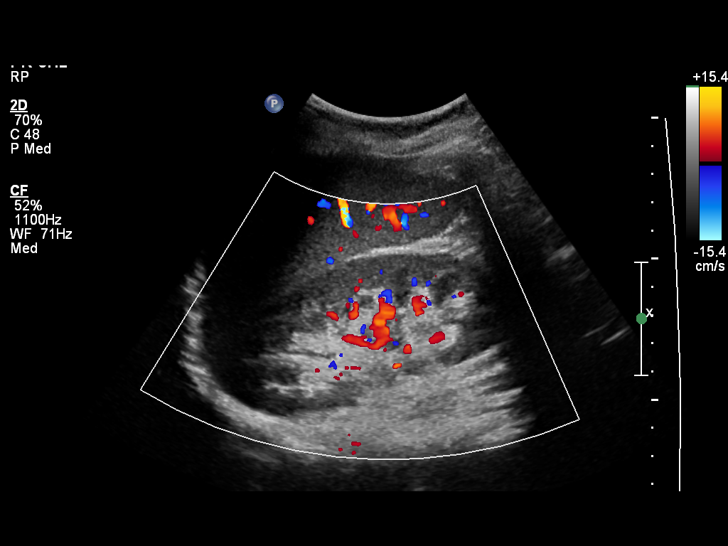
[im 31/42]
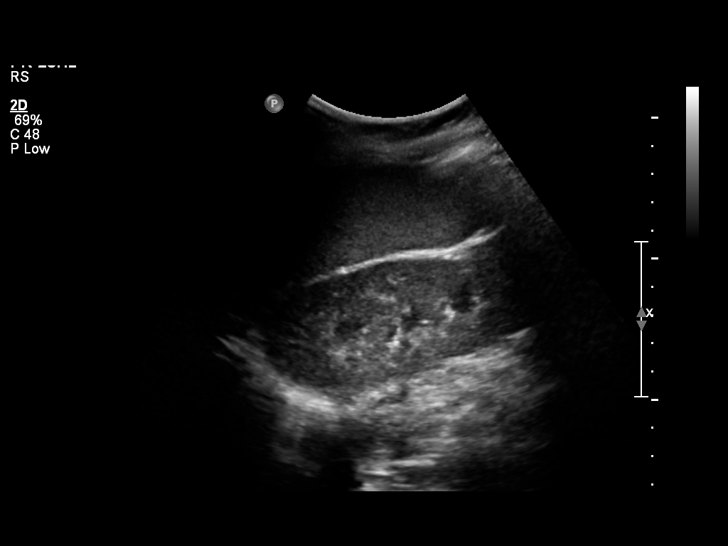
[im 35/42]
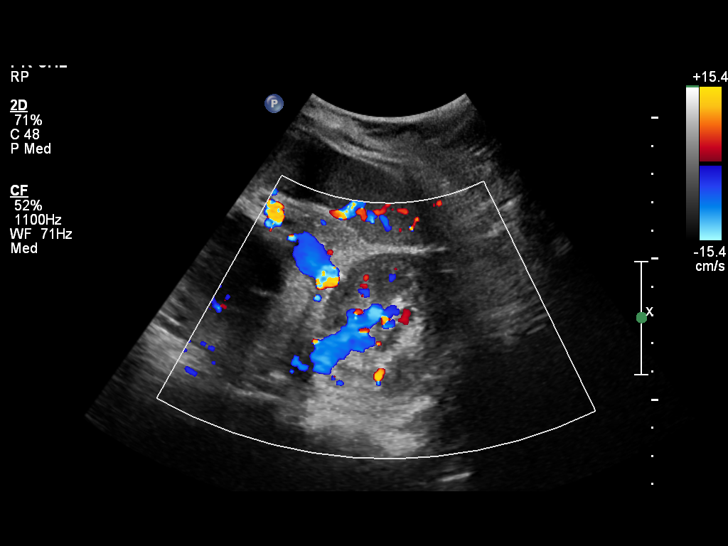
[im 38/42]
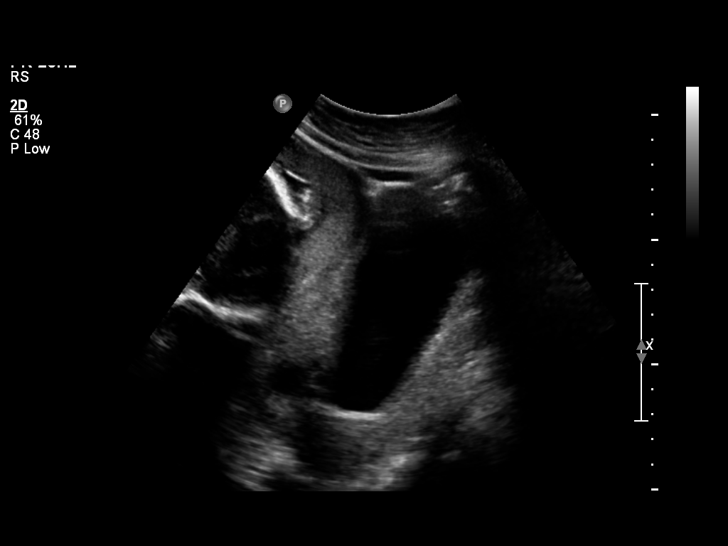
[im 42/42]
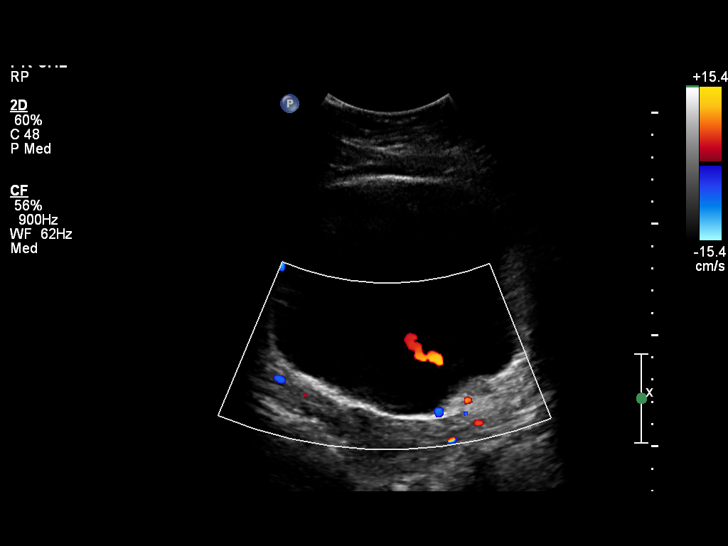

[14 of 25 positions shown; findings below may reference images not displayed]

FINDINGS: Right Kidney:

Length: 11.9 cm. Mild hydronephrosis is noted. No renal calculi or
solid renal mass identified.

Left Kidney:

Length: 11.6 cm. Echogenicity within normal limits. No mass or
hydronephrosis visualized.

Bladder:

Appears normal for degree of bladder distention. A left ureteral jet
is identified. Right ureteral jet is not noted.
IMPRESSION: Mild right hydronephrosis. Although this may be related to
hydronephrosis of pregnancy, lack of right ureteral jet could
represent right ureteral obstruction.

Unremarkable left kidney.

## 2019-07-18 ENCOUNTER — Other Ambulatory Visit: Payer: Self-pay

## 2019-07-18 DIAGNOSIS — Z20822 Contact with and (suspected) exposure to covid-19: Secondary | ICD-10-CM

## 2019-07-20 LAB — NOVEL CORONAVIRUS, NAA: SARS-CoV-2, NAA: NOT DETECTED
